# Patient Record
Sex: Male | Born: 1970 | Race: White | Hispanic: No | Smoking: Current every day smoker
Health system: Southern US, Community
[De-identification: ages and names within clinical notes are randomized; demographics above are authoritative.]

## PROBLEM LIST (undated history)

## (undated) DIAGNOSIS — M549 Dorsalgia, unspecified: Secondary | ICD-10-CM

## (undated) DIAGNOSIS — F4310 Post-traumatic stress disorder, unspecified: Secondary | ICD-10-CM

## (undated) DIAGNOSIS — F319 Bipolar disorder, unspecified: Secondary | ICD-10-CM

## (undated) DIAGNOSIS — F410 Panic disorder [episodic paroxysmal anxiety] without agoraphobia: Secondary | ICD-10-CM

## (undated) HISTORY — PX: FACIAL COSMETIC SURGERY: SHX629

## (undated) HISTORY — PX: FINGER SURGERY: SHX640

## (undated) HISTORY — PX: HAND SURGERY: SHX662

## (undated) HISTORY — PX: SHOULDER SURGERY: SHX246

## (undated) HISTORY — PX: CARPAL TUNNEL WITH CUBITAL TUNNEL: SHX5608

---

## 2001-05-05 ENCOUNTER — Emergency Department (HOSPITAL_COMMUNITY): Admission: EM | Admit: 2001-05-05 | Discharge: 2001-05-06 | Payer: Self-pay | Admitting: *Deleted

## 2001-05-06 ENCOUNTER — Encounter: Payer: Self-pay | Admitting: *Deleted

## 2001-07-04 ENCOUNTER — Encounter: Payer: Self-pay | Admitting: Orthopedic Surgery

## 2001-07-04 ENCOUNTER — Ambulatory Visit (HOSPITAL_COMMUNITY): Admission: RE | Admit: 2001-07-04 | Discharge: 2001-07-04 | Payer: Self-pay | Admitting: Orthopedic Surgery

## 2004-09-06 ENCOUNTER — Emergency Department (HOSPITAL_COMMUNITY): Admission: EM | Admit: 2004-09-06 | Discharge: 2004-09-06 | Payer: Self-pay | Admitting: Emergency Medicine

## 2005-06-21 ENCOUNTER — Ambulatory Visit: Payer: Self-pay | Admitting: Family Medicine

## 2005-06-22 ENCOUNTER — Ambulatory Visit: Payer: Self-pay | Admitting: Family Medicine

## 2005-12-25 ENCOUNTER — Emergency Department (HOSPITAL_COMMUNITY): Admission: EM | Admit: 2005-12-25 | Discharge: 2005-12-25 | Payer: Self-pay | Admitting: Emergency Medicine

## 2006-01-26 ENCOUNTER — Ambulatory Visit (HOSPITAL_COMMUNITY): Admission: RE | Admit: 2006-01-26 | Discharge: 2006-01-26 | Payer: Self-pay | Admitting: Orthopedic Surgery

## 2010-12-18 ENCOUNTER — Emergency Department (HOSPITAL_COMMUNITY): Payer: Self-pay

## 2010-12-18 ENCOUNTER — Emergency Department (HOSPITAL_COMMUNITY)
Admission: EM | Admit: 2010-12-18 | Discharge: 2010-12-18 | Disposition: A | Discharge status: Home / Self Care | Payer: Self-pay | Attending: Emergency Medicine | Admitting: Emergency Medicine

## 2010-12-18 DIAGNOSIS — F319 Bipolar disorder, unspecified: Secondary | ICD-10-CM | POA: Insufficient documentation

## 2010-12-18 DIAGNOSIS — J45909 Unspecified asthma, uncomplicated: Secondary | ICD-10-CM | POA: Insufficient documentation

## 2010-12-18 DIAGNOSIS — J069 Acute upper respiratory infection, unspecified: Secondary | ICD-10-CM | POA: Insufficient documentation

## 2010-12-18 DIAGNOSIS — R05 Cough: Secondary | ICD-10-CM | POA: Insufficient documentation

## 2010-12-18 DIAGNOSIS — R059 Cough, unspecified: Secondary | ICD-10-CM | POA: Insufficient documentation

## 2010-12-18 DIAGNOSIS — B9789 Other viral agents as the cause of diseases classified elsewhere: Secondary | ICD-10-CM | POA: Insufficient documentation

## 2010-12-18 DIAGNOSIS — Z79899 Other long term (current) drug therapy: Secondary | ICD-10-CM | POA: Insufficient documentation

## 2010-12-18 LAB — BASIC METABOLIC PANEL
BUN: 13 mg/dL (ref 6–23)
CO2: 25 mEq/L (ref 19–32)
Calcium: 9.3 mg/dL (ref 8.4–10.5)
Chloride: 102 mEq/L (ref 96–112)
Creatinine, Ser: 1.08 mg/dL (ref 0.4–1.5)
GFR calc Af Amer: >60 mL/min (ref >60)
GFR calc non Af Amer: >60 mL/min (ref >60)
Glucose, Bld: 106 mg/dL — ABNORMAL HIGH (ref 70–99)
Potassium: 3.7 mEq/L (ref 3.5–5.1)
Sodium: 136 mEq/L (ref 135–145)

## 2010-12-18 LAB — CBC
HCT: 43.6 % (ref 39.0–52.0)
Hemoglobin: 14.6 g/dL (ref 13.0–17.0)
MCH: 30.4 pg (ref 26.0–34.0)
MCHC: 33.5 g/dL (ref 30.0–36.0)
MCV: 90.8 fL (ref 78.0–100.0)
Platelets: 149 10*3/uL — ABNORMAL LOW (ref 150–400)
RBC: 4.8 MIL/uL (ref 4.22–5.81)
RDW: 13.5 % (ref 11.5–15.5)
WBC: 11.1 10*3/uL — ABNORMAL HIGH (ref 4.0–10.5)

## 2010-12-18 LAB — DIFFERENTIAL
Basophils Absolute: 0 10*3/uL (ref 0.0–0.1)
Basophils Relative: 0 % (ref 0–1)
Eosinophils Absolute: 0.1 10*3/uL (ref 0.0–0.7)
Eosinophils Relative: 1 % (ref 0–5)
Lymphocytes Relative: 20 % (ref 12–46)
Lymphs Abs: 2.2 10*3/uL (ref 0.7–4.0)
Monocytes Absolute: 0.7 10*3/uL (ref 0.1–1.0)
Monocytes Relative: 6 % (ref 3–12)
Neutro Abs: 8.1 10*3/uL — ABNORMAL HIGH (ref 1.7–7.7)
Neutrophils Relative %: 72 % (ref 43–77)

## 2011-03-26 ENCOUNTER — Encounter: Payer: Self-pay | Admitting: *Deleted

## 2011-03-26 ENCOUNTER — Emergency Department (HOSPITAL_COMMUNITY)
Admission: EM | Admit: 2011-03-26 | Discharge: 2011-03-26 | Disposition: A | Discharge status: Home / Self Care | Payer: Self-pay | Attending: Emergency Medicine | Admitting: Emergency Medicine

## 2011-03-26 DIAGNOSIS — F17200 Nicotine dependence, unspecified, uncomplicated: Secondary | ICD-10-CM | POA: Insufficient documentation

## 2011-03-26 DIAGNOSIS — M545 Low back pain, unspecified: Secondary | ICD-10-CM | POA: Insufficient documentation

## 2011-03-26 DIAGNOSIS — F4310 Post-traumatic stress disorder, unspecified: Secondary | ICD-10-CM | POA: Insufficient documentation

## 2011-03-26 DIAGNOSIS — F319 Bipolar disorder, unspecified: Secondary | ICD-10-CM | POA: Insufficient documentation

## 2011-03-26 DIAGNOSIS — S335XXA Sprain of ligaments of lumbar spine, initial encounter: Secondary | ICD-10-CM

## 2011-03-26 DIAGNOSIS — F411 Generalized anxiety disorder: Secondary | ICD-10-CM | POA: Insufficient documentation

## 2011-03-26 HISTORY — DX: Panic disorder (episodic paroxysmal anxiety): F41.0

## 2011-03-26 HISTORY — DX: Dorsalgia, unspecified: M54.9

## 2011-03-26 HISTORY — DX: Bipolar disorder, unspecified: F31.9

## 2011-03-26 HISTORY — DX: Post-traumatic stress disorder, unspecified: F43.10

## 2011-03-26 MED ORDER — KETOROLAC TROMETHAMINE 60 MG/2ML IM SOLN
60.0000 mg | Freq: Once | INTRAMUSCULAR | Status: AC
  Administered 2011-03-26: 60 mg via INTRAMUSCULAR
  Filled 2011-03-26: qty 2

## 2011-03-26 MED ORDER — HYDROMORPHONE HCL 1 MG/ML IJ SOLN
1.0000 mg | Freq: Once | INTRAMUSCULAR | Status: AC
  Administered 2011-03-26: 1 mg via INTRAMUSCULAR
  Filled 2011-03-26: qty 1

## 2011-03-26 MED ORDER — IBUPROFEN 800 MG PO TABS: 800.0000 mg | ORAL_TABLET | Freq: Three times a day (TID) | ORAL | Status: AC

## 2011-03-26 MED ORDER — OXYCODONE-ACETAMINOPHEN 5-325 MG PO TABS: 1.0000 | ORAL_TABLET | ORAL | Status: AC | PRN

## 2011-03-26 NOTE — ED Notes (Signed)
Pt c/o back pain x 15 years

## 2011-03-26 NOTE — ED Notes (Signed)
Pt d/c from ED at this time. Pt ambulatory with a steady gait. Pt was instructed not to drive on narcotics, and stated that his wife would be driving. D/c instructions discussed and understanding was voiced.

## 2011-03-26 NOTE — ED Provider Notes (Signed)
History     CSN: [id number] Arrival date & time: 03/26/2011  3:16 PM  Chief Complaint  Patient presents with  . Back Pain   Patient is a 40 y.o. male presenting with back pain. The history is provided by the patient.  Back Pain  This is a chronic problem. The problem occurs constantly. The problem has not changed since onset.Associated with: History of distant injury. The pain is present in the lumbar spine. The quality of the pain is described as stabbing and shooting. The pain is at a severity of 10/10. The pain is severe. The symptoms are aggravated by bending, twisting and certain positions. The pain is the same all the time. Pertinent negatives include no chest pain, no fever, no numbness, no headaches, no abdominal pain, no bowel incontinence, no perianal numbness, no bladder incontinence, no dysuria, no leg pain, no paresthesias, no paresis, no tingling and no weakness.    Past Medical History  Diagnosis Date  . Back pain   . PTSD (post-traumatic stress disorder)   . Anxiety attack   . Bipolar 1 disorder     Past Surgical History  Procedure Date  . Shoulder surgery right  . Finger surgery     History reviewed. No pertinent family history.  History  Substance Use Topics  . Smoking status: Current Some Day Smoker  . Smokeless tobacco: Not on file  . Alcohol Use: No      Review of Systems  Constitutional: Negative for fever.  HENT: Negative for congestion, sore throat and neck pain.   Eyes: Negative.   Respiratory: Negative for chest tightness and shortness of breath.   Cardiovascular: Negative for chest pain.  Gastrointestinal: Negative for nausea, abdominal pain and bowel incontinence.  Genitourinary: Negative.  Negative for bladder incontinence and dysuria.  Musculoskeletal: Positive for back pain. Negative for joint swelling and arthralgias.  Skin: Negative.  Negative for rash and wound.  Neurological: Negative for dizziness, tingling, weakness,  light-headedness, numbness, headaches and paresthesias.  Hematological: Negative.   Psychiatric/Behavioral: Negative.     Physical Exam  BP 133/85  Pulse 77  Temp(Src) 98.3 F (36.8 C) (Oral)  Resp 20  Ht 5\' 5"  (1.651 m)  Wt 180 lb (81.647 kg)  BMI 29.95 kg/m2  SpO2 100%  Physical Exam  Nursing note and vitals reviewed. Constitutional: He is oriented to person, place, and time. He appears well-developed and well-nourished.  HENT:  Head: Normocephalic and atraumatic.  Eyes: Conjunctivae are normal.  Neck: Normal range of motion.  Cardiovascular: Normal rate, regular rhythm, normal heart sounds and intact distal pulses.   Pulmonary/Chest: Effort normal and breath sounds normal. He has no wheezes.  Abdominal: Soft. Bowel sounds are normal. There is no tenderness.  Musculoskeletal: He exhibits tenderness. He exhibits no edema.       Lumbar back: He exhibits decreased range of motion, tenderness and bony tenderness. He exhibits no swelling, no deformity and no spasm.  Neurological: He is alert and oriented to person, place, and time. He has normal strength. He displays no atrophy. No sensory deficit. He exhibits normal muscle tone. Gait normal.  Reflex Scores:      Patellar reflexes are 2+ on the right side and 2+ on the left side.      Achilles reflexes are 2+ on the right side and 2+ on the left side.      Full ankle flexion and dorsiflexion.  Skin: Skin is warm and dry.  Psychiatric: He has a normal mood and  affect.    ED Course  Procedures  MDM Dilaudid 1 mg IM,  toradol 60 Im given.  Chronic low back pain with no neuro deficits,  No new injury      Candis Musa, PA 03/26/11 1718

## 2011-04-20 NOTE — ED Provider Notes (Signed)
Medical screening examination/treatment/procedure(s) were performed by non-physician practitioner and as supervising physician I was immediately available for consultation/collaboration.  Donnetta Hutching, MD 04/20/11 [phone]

## 2012-04-07 ENCOUNTER — Emergency Department (HOSPITAL_COMMUNITY): Payer: Self-pay

## 2012-04-07 ENCOUNTER — Emergency Department (HOSPITAL_COMMUNITY)
Admission: EM | Admit: 2012-04-07 | Discharge: 2012-04-07 | Disposition: A | Discharge status: Home / Self Care | Payer: Self-pay | Attending: Emergency Medicine | Admitting: Emergency Medicine

## 2012-04-07 ENCOUNTER — Encounter (HOSPITAL_COMMUNITY): Payer: Self-pay | Admitting: *Deleted

## 2012-04-07 DIAGNOSIS — F319 Bipolar disorder, unspecified: Secondary | ICD-10-CM | POA: Insufficient documentation

## 2012-04-07 DIAGNOSIS — J302 Other seasonal allergic rhinitis: Secondary | ICD-10-CM

## 2012-04-07 DIAGNOSIS — J019 Acute sinusitis, unspecified: Secondary | ICD-10-CM | POA: Insufficient documentation

## 2012-04-07 DIAGNOSIS — J309 Allergic rhinitis, unspecified: Secondary | ICD-10-CM | POA: Insufficient documentation

## 2012-04-07 DIAGNOSIS — F4310 Post-traumatic stress disorder, unspecified: Secondary | ICD-10-CM | POA: Insufficient documentation

## 2012-04-07 DIAGNOSIS — F17200 Nicotine dependence, unspecified, uncomplicated: Secondary | ICD-10-CM | POA: Insufficient documentation

## 2012-04-07 MED ORDER — AZITHROMYCIN 250 MG PO TABS: 500.0000 mg | ORAL_TABLET | Freq: Once | ORAL | Status: DC

## 2012-04-07 MED ORDER — SULFAMETHOXAZOLE-TMP DS 800-160 MG PO TABS
1.0000 | ORAL_TABLET | Freq: Once | ORAL | Status: AC
  Administered 2012-04-07: 1 via ORAL
  Filled 2012-04-07: qty 1

## 2012-04-07 MED ORDER — SULFAMETHOXAZOLE-TMP DS 800-160 MG PO TABS: 1.0000 | ORAL_TABLET | Freq: Two times a day (BID) | ORAL | Status: AC

## 2012-04-07 MED ORDER — FEXOFENADINE-PSEUDOEPHED ER 60-120 MG PO TB12: 1.0000 | ORAL_TABLET | Freq: Two times a day (BID) | ORAL | Status: DC

## 2012-04-07 NOTE — ED Notes (Signed)
Cough for 1 month, white sputum at times.  Coughed today and cannot hear out of rt ear.

## 2012-04-07 NOTE — ED Provider Notes (Signed)
History     CSN: [id number]  Arrival date & time 04/07/12  1932   First MD Initiated Contact with Patient 04/07/12 1959      Chief Complaint  Patient presents with  . Cough    (Consider location/radiation/quality/duration/timing/severity/associated sxs/prior treatment) HPI Comments: Thomas Bird presents with a one month history of persistent cough, nasal congestion drip and increasing right ear pain, pressure and decreased hearing on his right stating he feels like his ear is under water.  He denies current fevers or chills but states he had a several day history of fever when his symptoms began.  Nasal drainage has been thick and yellow without blood tinge.  He denies shortness of breath and chest pain.  He also denies sore throat but does have tenderness across his right cheek.  He has taken no medications for relief of symptoms.  The history is provided by the patient and the spouse.    Past Medical History  Diagnosis Date  . Back pain   . PTSD (post-traumatic stress disorder)   . Anxiety attack   . Bipolar 1 disorder     Past Surgical History  Procedure Date  . Shoulder surgery right  . Finger surgery     History reviewed. No pertinent family history.  History  Substance Use Topics  . Smoking status: Current Some Day Smoker  . Smokeless tobacco: Not on file  . Alcohol Use: No      Review of Systems  Constitutional: Negative for fever and chills.  HENT: Positive for hearing loss, congestion, rhinorrhea, postnasal drip and sinus pressure. Negative for ear pain, sore throat, facial swelling, neck pain, neck stiffness, voice change and tinnitus.   Eyes: Negative.   Respiratory: Positive for cough. Negative for chest tightness, shortness of breath, wheezing and stridor.   Cardiovascular: Negative for chest pain.  Gastrointestinal: Negative for nausea and abdominal pain.  Genitourinary: Negative.   Musculoskeletal: Negative for joint swelling and arthralgias.    Skin: Negative.  Negative for rash and wound.  Neurological: Negative for dizziness, weakness, light-headedness, numbness and headaches.  Hematological: Negative.   Psychiatric/Behavioral: Negative.     Allergies  Penicillins  Home Medications   Current Outpatient Rx  Name Route Sig Dispense Refill  . DIAZEPAM 10 MG PO TABS Oral Take 10 mg by mouth 5 (five) times daily.      Marland Kitchen DIVALPROEX SODIUM 500 MG PO TBEC Oral Take 1,000 mg by mouth at bedtime.     Marland Kitchen FEXOFENADINE-PSEUDOEPHED ER 60-120 MG PO TB12 Oral Take 1 tablet by mouth every 12 (twelve) hours. 30 tablet 0  . GABAPENTIN 300 MG PO CAPS Oral Take 300 mg by mouth 3 (three) times daily.      . SULFAMETHOXAZOLE-TMP DS 800-160 MG PO TABS Oral Take 1 tablet by mouth 2 (two) times daily. 28 tablet 0    BP 125/73  Pulse 75  Temp 98.4 F (36.9 C) (Oral)  Resp 20  Ht 5\' 5"  (1.651 m)  Wt 160 lb (72.576 kg)  BMI 26.63 kg/m2  SpO2 97%  Physical Exam  Nursing note and vitals reviewed. Constitutional: He appears well-developed and well-nourished.  HENT:  Head: Normocephalic and atraumatic.  Left Ear: External ear normal.  Nose: Mucosal edema and rhinorrhea present. No sinus tenderness. Right sinus exhibits maxillary sinus tenderness.       Right eardrum is retracted.  There is no effusion or purulence behind the TM.  Eyes: Conjunctivae are normal.  Neck: Normal range of  motion.  Cardiovascular: Normal rate, regular rhythm, normal heart sounds and intact distal pulses.   Pulmonary/Chest: Effort normal and breath sounds normal. He has no wheezes.  Abdominal: Soft. Bowel sounds are normal. There is no tenderness.  Musculoskeletal: Normal range of motion.  Neurological: He is alert.  Skin: Skin is warm and dry.  Psychiatric: He has a normal mood and affect.    ED Course  Procedures (including critical care time)  Labs Reviewed - No data to display Dg Chest 2 View  04/07/2012  *RADIOLOGY REPORT*  Clinical Data:  Cough and  congestion.  History of asthma.  CHEST - 2 VIEW  Comparison: 12/18/2010  Findings: The heart size and mediastinal contours are within normal limits.  Both lungs are clear.  The visualized skeletal structures are unremarkable.  IMPRESSION: No active disease.   Original Report Authenticated By: Reola Calkins, M.D.      1. Sinusitis acute   2. Seasonal allergic rhinitis       MDM  Patient was prescribed a 14 day course of Bactrim as he is pen allergic.  Also prescribed Allegra-D for nasal congestion and drainage.  Discussed smoking cessation, patient states has tried in the past without success and has been smoking since the age of 32.  He was referred for primary care to [facility] Department, in the interim recommended to return here if symptoms persist or worsen.        Burgess Amor, [place] 04/07/12 2116

## 2012-04-07 NOTE — ED Notes (Signed)
Pt taken to xray 

## 2012-04-08 NOTE — ED Provider Notes (Signed)
Medical screening examination/treatment/procedure(s) were performed by non-physician practitioner and as supervising physician I was immediately available for consultation/collaboration.   [place] Alfred Grosser L Macsen Nuttall, MD 04/08/12 1935 

## 2012-05-20 ENCOUNTER — Emergency Department (HOSPITAL_COMMUNITY): Payer: Self-pay

## 2012-05-20 ENCOUNTER — Encounter (HOSPITAL_COMMUNITY): Payer: Self-pay | Admitting: *Deleted

## 2012-05-20 ENCOUNTER — Emergency Department (HOSPITAL_COMMUNITY)
Admission: EM | Admit: 2012-05-20 | Discharge: 2012-05-21 | Disposition: A | Discharge status: Home / Self Care | Payer: Self-pay | Attending: Emergency Medicine | Admitting: Emergency Medicine

## 2012-05-20 DIAGNOSIS — J40 Bronchitis, not specified as acute or chronic: Secondary | ICD-10-CM | POA: Insufficient documentation

## 2012-05-20 DIAGNOSIS — M549 Dorsalgia, unspecified: Secondary | ICD-10-CM | POA: Insufficient documentation

## 2012-05-20 DIAGNOSIS — J029 Acute pharyngitis, unspecified: Secondary | ICD-10-CM

## 2012-05-20 DIAGNOSIS — R51 Headache: Secondary | ICD-10-CM | POA: Insufficient documentation

## 2012-05-20 MED ORDER — METHYLPREDNISOLONE SODIUM SUCC 125 MG IJ SOLR
125.0000 mg | Freq: Once | INTRAMUSCULAR | Status: AC
  Administered 2012-05-20: 125 mg via INTRAMUSCULAR
  Filled 2012-05-20: qty 2

## 2012-05-20 MED ORDER — HYDROCOD POLST-CHLORPHEN POLST 10-8 MG/5ML PO LQCR
5.0000 mL | Freq: Once | ORAL | Status: AC
  Administered 2012-05-20: 5 mL via ORAL
  Filled 2012-05-20: qty 5

## 2012-05-20 MED ORDER — ALBUTEROL SULFATE HFA 108 (90 BASE) MCG/ACT IN AERS
2.0000 | INHALATION_SPRAY | RESPIRATORY_TRACT | Status: DC
  Administered 2012-05-20: 2 via RESPIRATORY_TRACT
  Filled 2012-05-20: qty 6.7

## 2012-05-20 NOTE — ED Notes (Signed)
C/o throat swelling for 2 months, been antibiotics and has finished all of them, states that throat is raw

## 2012-05-20 NOTE — ED Provider Notes (Signed)
History     CSN: [id number]  Arrival date & time 05/20/12  2101   First MD Initiated Contact with Patient 05/20/12 2301      Chief Complaint  Patient presents with  . Sore Throat    (Consider location/radiation/quality/duration/timing/severity/associated sxs/prior treatment) Patient is a 41 y.o. male presenting with pharyngitis. The history is provided by the patient.  Sore Throat This is a recurrent problem. The current episode started more than 1 month ago. The problem has been gradually worsening. Associated symptoms include chills, congestion, headaches, myalgias and a sore throat. Pertinent negatives include no abdominal pain, arthralgias, chest pain, coughing or neck pain. Associated symptoms comments: Nasal congestion and hoarseness. The symptoms are aggravated by swallowing. He has tried nothing for the symptoms. The treatment provided no relief.    Past Medical History  Diagnosis Date  . Back pain   . PTSD (post-traumatic stress disorder)   . Anxiety attack   . Bipolar 1 disorder     Past Surgical History  Procedure Date  . Shoulder surgery right  . Finger surgery     History reviewed. No pertinent family history.  History  Substance Use Topics  . Smoking status: Current Some Day Smoker    Types: Cigarettes  . Smokeless tobacco: Not on file  . Alcohol Use: No      Review of Systems  Constitutional: Positive for chills. Negative for activity change.       All ROS Neg except as noted in HPI  HENT: Positive for congestion, sore throat and postnasal drip. Negative for nosebleeds and neck pain.   Eyes: Negative for photophobia and discharge.  Respiratory: Negative for cough, shortness of breath and wheezing.   Cardiovascular: Negative for chest pain and palpitations.  Gastrointestinal: Negative for abdominal pain and blood in stool.  Genitourinary: Negative for dysuria, frequency and hematuria.  Musculoskeletal: Positive for myalgias and back pain. Negative  for arthralgias.  Skin: Negative.   Neurological: Positive for headaches. Negative for dizziness, seizures and speech difficulty.  Psychiatric/Behavioral: Negative for hallucinations and confusion. The patient is nervous/anxious.     Allergies  Penicillins  Home Medications   Current Outpatient Rx  Name Route Sig Dispense Refill  . DIAZEPAM 10 MG PO TABS Oral Take 10 mg by mouth 3 (three) times daily.     Marland Kitchen DIVALPROEX SODIUM 500 MG PO TBEC Oral Take 1,000 mg by mouth at bedtime.     Marland Kitchen GABAPENTIN 300 MG PO CAPS Oral Take 300 mg by mouth 3 (three) times daily.        BP 118/77  Pulse 70  Temp 98.7 F (37.1 C) (Oral)  Resp 18  Ht 5\' 5"  (1.651 m)  Wt 145 lb (65.772 kg)  BMI 24.13 kg/m2  SpO2 99%  Physical Exam  Nursing note and vitals reviewed. Constitutional: He is oriented to person, place, and time. He appears well-developed and well-nourished.  Non-toxic appearance.  HENT:  Head: Normocephalic.  Right Ear: Tympanic membrane and external ear normal.  Left Ear: Tympanic membrane and external ear normal.  Eyes: EOM and lids are normal. Pupils are equal, round, and reactive to light.  Neck: Normal range of motion. Neck supple. Carotid bruit is not present. No tracheal deviation present.  Cardiovascular: Normal rate, regular rhythm, normal heart sounds, intact distal pulses and normal pulses.   Pulmonary/Chest: Breath sounds normal. No stridor. No respiratory distress.  Abdominal: Soft. Bowel sounds are normal. There is no tenderness. There is no guarding.  Musculoskeletal: Normal  range of motion.  Lymphadenopathy:       Head (right side): No submandibular adenopathy present.       Head (left side): No submandibular adenopathy present.    He has no cervical adenopathy.  Neurological: He is alert and oriented to person, place, and time. He has normal strength. No cranial nerve deficit or sensory deficit.  Skin: Skin is warm and dry.  Psychiatric: He has a normal mood and  affect. His speech is normal.    ED Course  Procedures (including critical care time)  Labs Reviewed - No data to display No results found.   No diagnosis found.    MDM   Soft tissue neck xray is negative. Vital signs stable . Pt able to manage salava, speaks incomplete sentences. Safe for pt to be discharged home.       Kathie Dike, [place] 05/26/12 [phone]

## 2012-05-21 MED ORDER — PROMETHAZINE-CODEINE 6.25-10 MG/5ML PO SYRP: 5.0000 mL | ORAL_SOLUTION | ORAL | Status: DC | PRN

## 2012-05-21 MED ORDER — PREDNISONE 10 MG PO TABS: ORAL_TABLET | ORAL | Status: DC

## 2012-05-21 MED ORDER — CIPROFLOXACIN HCL 500 MG PO TABS: 500.0000 mg | ORAL_TABLET | Freq: Two times a day (BID) | ORAL | Status: DC

## 2012-05-21 MED ORDER — ONDANSETRON 8 MG PO TBDP: 8.0000 mg | ORAL_TABLET | Freq: Once | ORAL | Status: DC

## 2012-05-21 MED ORDER — CIPROFLOXACIN HCL 250 MG PO TABS
500.0000 mg | ORAL_TABLET | Freq: Once | ORAL | Status: AC
  Administered 2012-05-21: 500 mg via ORAL
  Filled 2012-05-21: qty 2

## 2012-05-21 MED ORDER — PSEUDOEPHEDRINE HCL 60 MG PO TABS: ORAL_TABLET | ORAL | Status: DC

## 2012-05-21 NOTE — ED Notes (Signed)
Nausea gone and no longer feels faint

## 2012-05-27 NOTE — ED Provider Notes (Signed)
Medical screening examination/treatment/procedure(s) were performed by non-physician practitioner and as supervising physician I was immediately available for consultation/collaboration.  Nicoletta Dress. Colon Branch, MD 05/27/12 2342

## 2012-07-12 ENCOUNTER — Emergency Department (HOSPITAL_COMMUNITY)
Admission: EM | Admit: 2012-07-12 | Discharge: 2012-07-12 | Disposition: A | Discharge status: Home / Self Care | Payer: Self-pay | Attending: Emergency Medicine | Admitting: Emergency Medicine

## 2012-07-12 ENCOUNTER — Emergency Department (HOSPITAL_COMMUNITY): Payer: Self-pay

## 2012-07-12 ENCOUNTER — Encounter (HOSPITAL_COMMUNITY): Payer: Self-pay | Admitting: *Deleted

## 2012-07-12 DIAGNOSIS — F17200 Nicotine dependence, unspecified, uncomplicated: Secondary | ICD-10-CM | POA: Insufficient documentation

## 2012-07-12 DIAGNOSIS — Y939 Activity, unspecified: Secondary | ICD-10-CM | POA: Insufficient documentation

## 2012-07-12 DIAGNOSIS — W19XXXA Unspecified fall, initial encounter: Secondary | ICD-10-CM | POA: Insufficient documentation

## 2012-07-12 DIAGNOSIS — S62309A Unspecified fracture of unspecified metacarpal bone, initial encounter for closed fracture: Secondary | ICD-10-CM | POA: Insufficient documentation

## 2012-07-12 DIAGNOSIS — M2550 Pain in unspecified joint: Secondary | ICD-10-CM | POA: Insufficient documentation

## 2012-07-12 DIAGNOSIS — F411 Generalized anxiety disorder: Secondary | ICD-10-CM | POA: Insufficient documentation

## 2012-07-12 DIAGNOSIS — F4310 Post-traumatic stress disorder, unspecified: Secondary | ICD-10-CM | POA: Insufficient documentation

## 2012-07-12 DIAGNOSIS — F319 Bipolar disorder, unspecified: Secondary | ICD-10-CM | POA: Insufficient documentation

## 2012-07-12 DIAGNOSIS — Y929 Unspecified place or not applicable: Secondary | ICD-10-CM | POA: Insufficient documentation

## 2012-07-12 DIAGNOSIS — Z79899 Other long term (current) drug therapy: Secondary | ICD-10-CM | POA: Insufficient documentation

## 2012-07-12 MED ORDER — HYDROCODONE-ACETAMINOPHEN 5-325 MG PO TABS
2.0000 | ORAL_TABLET | Freq: Once | ORAL | Status: AC
  Administered 2012-07-12: 2 via ORAL
  Filled 2012-07-12: qty 2

## 2012-07-12 MED ORDER — IBUPROFEN 800 MG PO TABS
800.0000 mg | ORAL_TABLET | Freq: Once | ORAL | Status: AC
  Administered 2012-07-12: 800 mg via ORAL
  Filled 2012-07-12: qty 1

## 2012-07-12 MED ORDER — HYDROCODONE-ACETAMINOPHEN 5-325 MG PO TABS: ORAL_TABLET | ORAL | Status: DC

## 2012-07-12 MED ORDER — IBUPROFEN 800 MG PO TABS: 800.0000 mg | ORAL_TABLET | Freq: Three times a day (TID) | ORAL | Status: DC

## 2012-07-12 NOTE — ED Provider Notes (Signed)
History     CSN: [id number]  Arrival date & time 07/12/12  1343   First MD Initiated Contact with Patient 07/12/12 1401      Chief Complaint  Patient presents with  . Hand Pain    (Consider location/radiation/quality/duration/timing/severity/associated sxs/prior treatment) HPI Comments: Patient c/o persistent pain and swelling to the right hand for one week.  Pain began after a fall onto concrete with fists clenched.  States he was seen at another ED and had a temporary cast placed.  He states that he had to remove it two days later due to increased pain and swelling to his hand.  States that he was advised to follow-up with orthopedics, but does not have the finances to do so.  He denies numbness or weakness of the fingers, redness, or proximal pain.    Patient is a 41 y.o. male presenting with hand pain. The history is provided by the patient.  Hand Pain This is a new problem. The current episode started 1 to 4 weeks ago. The problem occurs constantly. The problem has been unchanged. Associated symptoms include arthralgias and joint swelling. Pertinent negatives include no chest pain, chills, fever, nausea, neck pain, numbness, rash, vomiting or weakness. The symptoms are aggravated by bending (movement and palpation). He has tried NSAIDs and immobilization for the symptoms. The treatment provided no relief.    Past Medical History  Diagnosis Date  . Back pain   . PTSD (post-traumatic stress disorder)   . Anxiety attack   . Bipolar 1 disorder     Past Surgical History  Procedure Date  . Shoulder surgery right  . Finger surgery     No family history on file.  History  Substance Use Topics  . Smoking status: Current Some Day Smoker    Types: Cigarettes  . Smokeless tobacco: Not on file  . Alcohol Use: No      Review of Systems  Constitutional: Negative for fever and chills.  HENT: Negative for neck pain.   Cardiovascular: Negative for chest pain.  Gastrointestinal:  Negative for nausea and vomiting.  Genitourinary: Negative for dysuria and difficulty urinating.  Musculoskeletal: Positive for joint swelling and arthralgias.  Skin: Negative for color change, rash and wound.  Neurological: Negative for weakness and numbness.  All other systems reviewed and are negative.    Allergies  Penicillins  Home Medications   Current Outpatient Rx  Name  Route  Sig  Dispense  Refill  . DIAZEPAM 10 MG PO TABS   Oral   Take 10 mg by mouth 3 (three) times daily.          Marland Kitchen DIVALPROEX SODIUM 500 MG PO TBEC   Oral   Take 1,000 mg by mouth at bedtime.            BP 112/70  Pulse 74  Temp 97.8 F (36.6 C) (Oral)  Resp 20  Ht 5\' 5"  (1.651 m)  Wt 155 lb 6 oz (70.478 kg)  BMI 25.86 kg/m2  SpO2 100%  Physical Exam  Nursing note and vitals reviewed. Constitutional: He is oriented to person, place, and time. He appears well-developed and well-nourished. No distress.  HENT:  Head: Normocephalic and atraumatic.  Cardiovascular: Normal rate, regular rhythm, normal heart sounds and intact distal pulses.   Pulmonary/Chest: Effort normal and breath sounds normal.  Musculoskeletal: He exhibits tenderness. He exhibits no edema.       Right hand: He exhibits decreased range of motion, tenderness, bony tenderness, deformity and  swelling. He exhibits normal two-point discrimination, normal capillary refill and no laceration. normal sensation noted. Normal strength noted.       Hands:      ttp of the dorsal right hand. Moderate STS present.  Pain reproduced with extension of the fifth finger.  Radial pulse is brisk, distal sensation intact.  CR< 2 sec.    Neurological: He is alert and oriented to person, place, and time. He exhibits normal muscle tone. Coordination normal.  Skin: Skin is warm and dry.    ED Course  Procedures (including critical care time)  Labs Reviewed - No data to display Dg Hand Complete Right  07/12/2012  *RADIOLOGY REPORT*  Clinical  Data: Fall, fifth metacarpal pain  RIGHT HAND - COMPLETE 3+ VIEW  Comparison: None.  Findings: Three views of the right hand submitted.  Mild displaced fracture distal aspect right fifth metacarpal.  IMPRESSION: Mild displaced fracture distal aspect right fifth metacarpal.   Original Report Authenticated By: Natasha Mead, M.D.      Ulnar gutter splint applied to the right hand by nursing. Pain improved. Remains neurovascularly intact.   MDM    Patient with a mildly displaced fifth metacarpal fracture. No focal neuro deficits on exam.  I will refer to orthopedics, Dr. Romeo Apple.  Patient reviewed on the [facility] narcotics database. Last narcotic prescription was #8 Percocet tablets on 06/30/2012  Prescribed: norco #15 ibuprofen  Alka Falwell L. [place], [place] 07/13/12 2202

## 2012-07-12 NOTE — ED Notes (Signed)
Reports fell and broke right hand x 1 wk ago.  Seen at [facility] ED and given temporary cast.  States was told to go to PCP for permanent cast.  Reports removed temporary cast due to pain and swelling.  Here today to have a cast put on.

## 2012-07-14 NOTE — ED Provider Notes (Signed)
Medical screening examination/treatment/procedure(s) were performed by non-physician practitioner and as supervising physician I was immediately available for consultation/collaboration. Shanee Batch, MD, FACEP   Jozeph Persing L Mancel Lardizabal, MD 07/14/12 2119 

## 2013-02-19 ENCOUNTER — Emergency Department (HOSPITAL_COMMUNITY)
Admission: EM | Admit: 2013-02-19 | Discharge: 2013-02-20 | Disposition: A | Discharge status: Home / Self Care | Payer: Self-pay | Attending: Emergency Medicine | Admitting: Emergency Medicine

## 2013-02-19 ENCOUNTER — Emergency Department (HOSPITAL_COMMUNITY): Payer: Self-pay

## 2013-02-19 ENCOUNTER — Encounter (HOSPITAL_COMMUNITY): Payer: Self-pay | Admitting: *Deleted

## 2013-02-19 DIAGNOSIS — Z880 Allergy status to penicillin: Secondary | ICD-10-CM | POA: Insufficient documentation

## 2013-02-19 DIAGNOSIS — IMO0002 Reserved for concepts with insufficient information to code with codable children: Secondary | ICD-10-CM | POA: Insufficient documentation

## 2013-02-19 DIAGNOSIS — S46911A Strain of unspecified muscle, fascia and tendon at shoulder and upper arm level, right arm, initial encounter: Secondary | ICD-10-CM

## 2013-02-19 DIAGNOSIS — X58XXXA Exposure to other specified factors, initial encounter: Secondary | ICD-10-CM | POA: Insufficient documentation

## 2013-02-19 DIAGNOSIS — Z9889 Other specified postprocedural states: Secondary | ICD-10-CM | POA: Insufficient documentation

## 2013-02-19 DIAGNOSIS — F17200 Nicotine dependence, unspecified, uncomplicated: Secondary | ICD-10-CM | POA: Insufficient documentation

## 2013-02-19 DIAGNOSIS — Z8659 Personal history of other mental and behavioral disorders: Secondary | ICD-10-CM | POA: Insufficient documentation

## 2013-02-19 DIAGNOSIS — Y929 Unspecified place or not applicable: Secondary | ICD-10-CM | POA: Insufficient documentation

## 2013-02-19 DIAGNOSIS — Y939 Activity, unspecified: Secondary | ICD-10-CM | POA: Insufficient documentation

## 2013-02-19 DIAGNOSIS — Z79899 Other long term (current) drug therapy: Secondary | ICD-10-CM | POA: Insufficient documentation

## 2013-02-19 MED ORDER — HYDROCODONE-ACETAMINOPHEN 5-325 MG PO TABS
1.0000 | ORAL_TABLET | Freq: Once | ORAL | Status: AC
  Administered 2013-02-19: 1 via ORAL
  Filled 2013-02-19: qty 1

## 2013-02-19 MED ORDER — HYDROCODONE-ACETAMINOPHEN 5-325 MG PO TABS: 1.0000 | ORAL_TABLET | ORAL | Status: DC | PRN

## 2013-02-19 MED ORDER — ACYCLOVIR 800 MG PO TABS: 800.0000 mg | ORAL_TABLET | Freq: Every day | ORAL | Status: DC

## 2013-02-19 NOTE — ED Notes (Signed)
Rt shoulder pain since yesterday, no known injury.

## 2013-02-23 NOTE — ED Provider Notes (Signed)
History    CSN: [id number] Arrival date & time 02/19/13  2058  First MD Initiated Contact with Patient 02/19/13 2152     Chief Complaint  Patient presents with  . Shoulder Pain   (Consider location/radiation/quality/duration/timing/severity/associated sxs/prior Treatment) HPI Comments: Thomas Bird is a 42 y.o. Male presenting with right posterior shoulder pain which has been present for the past week,  But worse since yesterday.  He describes a burning pain that occasionally radiates into his right upper posterior arm.  He denies injury, but does have a history of prior rotator cuff repair of this shoulder.  Pain is constant,  But worse with movement.  He denies weakness or numbness in his right arm and denies fevers, chills, neck pain or injury and denies rash.He has taken no medicines and has found no alleviators of his pain.     The history is provided by the patient and the spouse.   Past Medical History  Diagnosis Date  . Back pain   . PTSD (post-traumatic stress disorder)   . Anxiety attack   . Bipolar 1 disorder    Past Surgical History  Procedure Laterality Date  . Shoulder surgery  right  . Finger surgery     History reviewed. No pertinent family history. History  Substance Use Topics  . Smoking status: Current Some Day Smoker    Types: Cigarettes  . Smokeless tobacco: Not on file  . Alcohol Use: No    Review of Systems  Constitutional: Negative for fever and chills.  Musculoskeletal: Positive for arthralgias. Negative for myalgias and joint swelling.  Skin: Negative for color change, rash and wound.  Neurological: Negative for weakness and numbness.    Allergies  Penicillins  Home Medications   Current Outpatient Rx  Name  Route  Sig  Dispense  Refill  . acyclovir (ZOVIRAX) 800 MG tablet   Oral   Take 1 tablet (800 mg total) by mouth 5 (five) times daily.   35 tablet   0   . HYDROcodone-acetaminophen (NORCO/VICODIN) 5-325 MG per tablet  Oral   Take 1 tablet by mouth every 4 (four) hours as needed for pain.   15 tablet   0    BP 134/84  Pulse 80  Temp(Src) 98.1 F (36.7 C) (Oral)  Resp 20  Ht 5\' 5"  (1.651 m)  Wt 150 lb (68.04 kg)  BMI 24.96 kg/m2  SpO2 98% Physical Exam  Constitutional: He appears well-developed and well-nourished.  HENT:  Head: Atraumatic.  Neck: Normal range of motion.  Cardiovascular:  Pulses equal bilaterally  Musculoskeletal: He exhibits tenderness. He exhibits no edema.       Right shoulder: He exhibits tenderness. He exhibits normal range of motion, no bony tenderness, no swelling, no effusion, no crepitus, no deformity, no spasm and normal strength.       Arms: Neurological: He is alert. He has normal strength. He displays normal reflexes. No sensory deficit.  Equal strength  Skin: Skin is warm and dry.  Psychiatric: He has a normal mood and affect.    ED Course  Procedures (including critical care time) Labs Reviewed - No data to display No results found. 1. 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  He was prescribed hydrocodone for pain, encouraged warm compresses. Burning sensation suspicious for early shingles flare - he was also prescribed acyclovir,  But encouraged to get this  filled only if rash develops.  Resource guide given for obtaining pcp, in interim,  Return here for worsened sx.  Burgess Amor, PA-C 02/23/13 2333

## 2013-02-24 NOTE — ED Provider Notes (Signed)
Medical screening examination/treatment/procedure(s) were performed by non-physician practitioner and as supervising physician I was immediately available for consultation/collaboration.   Benny Lennert, MD 02/24/13 [phone]

## 2013-05-20 ENCOUNTER — Emergency Department (HOSPITAL_COMMUNITY)
Admission: EM | Admit: 2013-05-20 | Discharge: 2013-05-20 | Disposition: A | Discharge status: Home / Self Care | Payer: No Typology Code available for payment source | Attending: Emergency Medicine | Admitting: Emergency Medicine

## 2013-05-20 ENCOUNTER — Encounter (HOSPITAL_COMMUNITY): Payer: Self-pay | Admitting: Emergency Medicine

## 2013-05-20 ENCOUNTER — Emergency Department (HOSPITAL_COMMUNITY): Payer: No Typology Code available for payment source

## 2013-05-20 DIAGNOSIS — Z8659 Personal history of other mental and behavioral disorders: Secondary | ICD-10-CM | POA: Insufficient documentation

## 2013-05-20 DIAGNOSIS — Y9389 Activity, other specified: Secondary | ICD-10-CM | POA: Insufficient documentation

## 2013-05-20 DIAGNOSIS — S93409A Sprain of unspecified ligament of unspecified ankle, initial encounter: Secondary | ICD-10-CM | POA: Insufficient documentation

## 2013-05-20 DIAGNOSIS — Y92410 Unspecified street and highway as the place of occurrence of the external cause: Secondary | ICD-10-CM | POA: Insufficient documentation

## 2013-05-20 DIAGNOSIS — F17200 Nicotine dependence, unspecified, uncomplicated: Secondary | ICD-10-CM | POA: Insufficient documentation

## 2013-05-20 DIAGNOSIS — Z880 Allergy status to penicillin: Secondary | ICD-10-CM | POA: Insufficient documentation

## 2013-05-20 MED ORDER — HYDROCODONE-ACETAMINOPHEN 5-325 MG PO TABS
1.0000 | ORAL_TABLET | Freq: Once | ORAL | Status: AC
  Administered 2013-05-20: 1 via ORAL
  Filled 2013-05-20: qty 1

## 2013-05-20 MED ORDER — IBUPROFEN 600 MG PO TABS: 600.0000 mg | ORAL_TABLET | Freq: Four times a day (QID) | ORAL | Status: DC | PRN

## 2013-05-20 MED ORDER — HYDROCODONE-ACETAMINOPHEN 5-325 MG PO TABS: 1.0000 | ORAL_TABLET | Freq: Four times a day (QID) | ORAL | Status: DC | PRN
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
## 2013-05-20 NOTE — ED Notes (Signed)
Patient provided ice pack for right ankle.
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
## 2013-05-20 NOTE — ED Provider Notes (Signed)
Medical screening examination/treatment/procedure(s) were performed by non-physician practitioner and as supervising physician I was immediately available for consultation/collaboration.    Kanija Remmel D Ludwin Flahive, MD 05/20/13 2000 

## 2015-04-28 ENCOUNTER — Encounter: Payer: Self-pay | Admitting: Family Medicine

## 2015-04-28 ENCOUNTER — Ambulatory Visit: Payer: Self-pay | Admitting: Family Medicine

## 2015-04-28 VITALS — BP 117/73 | HR 62 | Temp 97.4°F | Ht 65.0 in | Wt 148.6 lb

## 2015-04-28 DIAGNOSIS — F4310 Post-traumatic stress disorder, unspecified: Secondary | ICD-10-CM

## 2015-04-28 DIAGNOSIS — M79643 Pain in unspecified hand: Secondary | ICD-10-CM

## 2015-04-28 DIAGNOSIS — M21931 Unspecified acquired deformity of right forearm: Secondary | ICD-10-CM
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
## 2016-05-11 ENCOUNTER — Encounter (HOSPITAL_COMMUNITY): Payer: Self-pay | Admitting: Emergency Medicine

## 2016-05-11 ENCOUNTER — Emergency Department (HOSPITAL_COMMUNITY)
Admission: EM | Admit: 2016-05-11 | Discharge: 2016-05-13 | Disposition: A | Discharge status: Other Acute Inpatient Hospital | Payer: Self-pay

## 2016-05-11 DIAGNOSIS — Y939 Activity, unspecified: Secondary | ICD-10-CM | POA: Insufficient documentation

## 2016-05-11 DIAGNOSIS — F1010 Alcohol abuse, uncomplicated: Secondary | ICD-10-CM | POA: Diagnosis present

## 2016-05-11 DIAGNOSIS — Z79899 Other long term (current) drug therapy: Secondary | ICD-10-CM | POA: Insufficient documentation

## 2016-05-11 DIAGNOSIS — S40012A Contusion of left shoulder, initial encounter: Secondary | ICD-10-CM | POA: Insufficient documentation

## 2016-05-11 DIAGNOSIS — R45851 Suicidal ideations: Secondary | ICD-10-CM | POA: Insufficient documentation

## 2016-05-11 DIAGNOSIS — F332 Major depressive disorder, recurrent severe without psychotic features: Secondary | ICD-10-CM | POA: Diagnosis present

## 2016-05-11 DIAGNOSIS — F17210 Nicotine dependence, cigarettes, uncomplicated: Secondary | ICD-10-CM | POA: Insufficient documentation

## 2016-05-11 DIAGNOSIS — Y929 Unspecified place or not applicable: Secondary | ICD-10-CM | POA: Insufficient documentation

## 2016-05-11 DIAGNOSIS — Y999 Unspecified external cause status: Secondary | ICD-10-CM | POA: Insufficient documentation

## 2016-05-11 MED ORDER — LORAZEPAM 1 MG PO TABS
0.0000 mg | ORAL_TABLET | Freq: Four times a day (QID) | ORAL | Status: AC
  Administered 2016-05-12 – 2016-05-13 (×2): 1 mg via ORAL
  Filled 2016-05-11 (×2): qty 1

## 2016-05-11 MED ORDER — NAPROXEN 250 MG PO TABS
500.0000 mg | ORAL_TABLET | Freq: Once | ORAL | Status: AC
  Administered 2016-05-12: 500 mg via ORAL
  Filled 2016-05-11: qty 2

## 2016-05-11 MED ORDER — LORAZEPAM 1 MG PO TABS: 0.0000 mg | ORAL_TABLET | Freq: Two times a day (BID) | ORAL | Status: DC
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
## 2016-05-11 NOTE — ED Notes (Signed)
Staffing office and charge nurse notified for pt.'s sitter , purple scrubs given to pt. , security notified to wand pt.  

## 2016-05-12 ENCOUNTER — Emergency Department (HOSPITAL_COMMUNITY): Payer: Self-pay

## 2016-05-12 LAB — CBC WITH DIFFERENTIAL/PLATELET
Basophils Absolute: 0 10*3/uL (ref 0.0–0.1)
Basophils Relative: 0 %
Eosinophils Absolute: 0.2 10*3/uL (ref 0.0–0.7)
Eosinophils Relative: 2 %
HCT: 44.9 % (ref 39.0–52.0)
Hemoglobin: 15 g/dL (ref 13.0–17.0)
Lymphocytes Relative: 50 %
Lymphs Abs: 4.1 10*3/uL — ABNORMAL HIGH (ref 0.7–4.0)
MCH: 31.3 pg (ref 26.0–34.0)
MCHC: 33.4 g/dL (ref 30.0–36.0)
MCV: 93.5 fL (ref 78.0–100.0)
Monocytes Absolute: 0.7 10*3/uL (ref 0.1–1.0)
Monocytes Relative: 8 %
Neutro Abs: 3.3 10*3/uL (ref 1.7–7.7)
Neutrophils Relative %: 40 %
Platelets: 180 10*3/uL (ref 150–400)
RBC: 4.8 MIL/uL (ref 4.22–5.81)
RDW: 13.8 % (ref 11.5–15.5)
WBC: 8.2 10*3/uL (ref 4.0–10.5)

## 2016-05-12 LAB — URINALYSIS, ROUTINE W REFLEX MICROSCOPIC
Bilirubin Urine: NEGATIVE
Glucose, UA: NEGATIVE mg/dL
Hgb urine dipstick: NEGATIVE
Ketones, ur: NEGATIVE mg/dL
Leukocytes, UA: NEGATIVE
Nitrite: NEGATIVE
Protein, ur: NEGATIVE mg/dL
Specific Gravity, Urine: 1.017 (ref 1.005–1.030)
pH: 7 (ref 5.0–8.0)

## 2016-05-12 LAB — RAPID URINE DRUG SCREEN, HOSP PERFORMED
Amphetamines: NOT DETECTED
Barbiturates: NOT DETECTED
Benzodiazepines: NOT DETECTED
Cocaine: NOT DETECTED
Opiates: NOT DETECTED
Tetrahydrocannabinol: POSITIVE — AB

## 2016-05-12 LAB — COMPREHENSIVE METABOLIC PANEL
ALT: 12 U/L — ABNORMAL LOW (ref 17–63)
AST: 18 U/L (ref 15–41)
Albumin: 3.8 g/dL (ref 3.5–5.0)
Alkaline Phosphatase: 54 U/L (ref 38–126)
Anion gap: 9 (ref 5–15)
BUN: 10 mg/dL (ref 6–20)
CO2: 29 mmol/L (ref 22–32)
Calcium: 9 mg/dL (ref 8.9–10.3)
Chloride: 102 mmol/L (ref 101–111)
Creatinine, Ser: 0.94 mg/dL (ref 0.61–1.24)
GFR calc Af Amer: >60 mL/min (ref >60)
GFR calc non Af Amer: >60 mL/min (ref >60)
Glucose, Bld: 86 mg/dL (ref 65–99)
Potassium: 3.8 mmol/L (ref 3.5–5.1)
Sodium: 140 mmol/L (ref 135–145)
Total Bilirubin: 0.5 mg/dL (ref 0.3–1.2)
Total Protein: 6.3 g/dL — ABNORMAL LOW (ref 6.5–8.1)

## 2016-05-12 LAB — ETHANOL: Alcohol, Ethyl (B): <5 mg/dL (ref <5)

## 2016-05-12 LAB — ACETAMINOPHEN LEVEL: Acetaminophen (Tylenol), Serum: <10 ug/mL — ABNORMAL LOW (ref 10–30)

## 2016-05-12 LAB — SALICYLATE LEVEL: Salicylate Lvl: <4 mg/dL (ref 2.8–30.0)

## 2016-05-12 MED ORDER — NICOTINE 21 MG/24HR TD PT24
21.0000 mg | MEDICATED_PATCH | Freq: Once | TRANSDERMAL | Status: AC
  Administered 2016-05-12: 21 mg via TRANSDERMAL
  Filled 2016-05-12: qty 1

## 2016-05-12 MED ORDER — NAPROXEN 250 MG PO TABS
500.0000 mg | ORAL_TABLET | Freq: Two times a day (BID) | ORAL | Status: DC
  Administered 2016-05-12 – 2016-05-13 (×4): 500 mg via ORAL
  Filled 2016-05-12 (×4): qty 2

## 2016-05-12 MED ORDER — NAPROXEN 250 MG PO TABS: 500.0000 mg | ORAL_TABLET | Freq: Two times a day (BID) | ORAL | Status: DC

## 2016-05-12 NOTE — ED Notes (Signed)
Pt.'s personal belongings given to parents to take home .

## 2016-05-12 NOTE — ED Notes (Signed)
3PM snack given. 

## 2016-05-12 NOTE — ED Notes (Signed)
Patient was given a snack and drink, a regular diet ordered for lunch. 

## 2016-05-12 NOTE — ED Notes (Signed)
Pt given 9 pm snack.

## 2016-05-12 NOTE — ED Notes (Signed)
TTS machine placed at bedside.   
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
## 2016-05-12 NOTE — ED Notes (Signed)
Lunch tray at bedside. ?

## 2016-05-12 NOTE — ED Notes (Signed)
Called pharmacy and they will change time on the Naproxyn.
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
## 2016-05-12 NOTE — ED Notes (Signed)
[facility], Gearldine BienenstockBrandy advised that she does not think there will be a bed available for patient today.

## 2016-05-13 ENCOUNTER — Encounter (HOSPITAL_COMMUNITY): Payer: Self-pay | Admitting: Family

## 2016-05-13 DIAGNOSIS — F17210 Nicotine dependence, cigarettes, uncomplicated: Secondary | ICD-10-CM

## 2016-05-13 DIAGNOSIS — Z818 Family history of other mental and behavioral disorders: Secondary | ICD-10-CM

## 2016-05-13 DIAGNOSIS — R45851 Suicidal ideations: Secondary | ICD-10-CM

## 2016-05-13 DIAGNOSIS — F1010 Alcohol abuse, uncomplicated: Secondary | ICD-10-CM | POA: Diagnosis present

## 2016-05-13 DIAGNOSIS — F332 Major depressive disorder, recurrent severe without psychotic features: Secondary | ICD-10-CM

## 2016-05-13 MED ORDER — NICOTINE 21 MG/24HR TD PT24
21.0000 mg | MEDICATED_PATCH | Freq: Once | TRANSDERMAL | Status: DC
  Administered 2016-05-13: 21 mg via TRANSDERMAL
  Filled 2016-05-13: qty 1

## 2016-05-13 NOTE — ED Provider Notes (Signed)
[facility]. Patient's transfer paperwork completed including Laurelyn SickleEMTALLA   Araminta Zorn, MD 05/13/16 2338
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
## 2016-05-13 NOTE — Progress Notes (Signed)
[facility] called to advise that [facility] was accepted [facility] [place] [facility], to Dr. Robet Leuhotakura, can call report this evening at [phone], bed is ready. RN Arlys JohnBrian has been informed. Patient willing to go volutary. Per RN Arlys JohnBrian, [facility]. [facility].  Thomas Bird, LCSWA Disposition staff 05/13/2016 11:04 PM

## 2016-05-13 NOTE — ED Notes (Signed)
Pt taken to shower room appears in no distress cooperative

## 2016-05-13 NOTE — ED Notes (Signed)
Patient was given a snack and drink, and a regular diet ordered for lunch. 

## 2016-05-13 NOTE — ED Notes (Addendum)
Behavioral health counselor now speaking with patient via telepsych

## 2016-05-13 NOTE — Progress Notes (Signed)
Referred pt to the following for inpatient treatment at recommendation of [facility] per [facility]- per Charl[facility] Boxer[facility] Left voicemail with Turner DanielsRowan, will refer if there is bed availability.  Ilean SkillMeghan Asuna Peth, MSW, LCSW Clinical Social Work, Disposition  05/13/2016 [phone]

## 2016-05-14 NOTE — ED Notes (Signed)
[facility]  [facility].  Patient in no signs of distress at this time.

## 2017-08-31 ENCOUNTER — Other Ambulatory Visit: Payer: Self-pay

## 2017-08-31 ENCOUNTER — Encounter (HOSPITAL_COMMUNITY): Payer: Self-pay | Admitting: Emergency Medicine

## 2017-08-31 DIAGNOSIS — K610 Anal abscess: Secondary | ICD-10-CM | POA: Insufficient documentation

## 2017-08-31 DIAGNOSIS — F17210 Nicotine dependence, cigarettes, uncomplicated: Secondary | ICD-10-CM | POA: Insufficient documentation

## 2017-08-31 NOTE — ED Notes (Signed)
Pt remains in waiting room. Updated on wait for treatment room. 

## 2017-08-31 NOTE — ED Triage Notes (Signed)
Pt c/o abscess "the size of my thumb" in his rectum. Pt reports first noticing the area this morning. Hx of abscesses.

## 2017-09-01 ENCOUNTER — Emergency Department (HOSPITAL_COMMUNITY)
Admission: EM | Admit: 2017-09-01 | Discharge: 2017-09-01 | Disposition: A | Discharge status: Home / Self Care | Payer: Self-pay | Attending: Emergency Medicine | Admitting: Emergency Medicine

## 2017-09-01 ENCOUNTER — Emergency Department (HOSPITAL_COMMUNITY): Payer: Self-pay

## 2017-09-01 DIAGNOSIS — K610 Anal abscess: Secondary | ICD-10-CM

## 2017-09-01 LAB — CBC WITH DIFFERENTIAL/PLATELET
Basophils Absolute: 0 10*3/uL (ref 0.0–0.1)
Basophils Relative: 0 %
Eosinophils Absolute: 0.2 10*3/uL (ref 0.0–0.7)
Eosinophils Relative: 2 %
HCT: 40.9 % (ref 39.0–52.0)
Hemoglobin: 13.9 g/dL (ref 13.0–17.0)
Lymphocytes Relative: 41 %
Lymphs Abs: 3.7 10*3/uL (ref 0.7–4.0)
MCH: 31.3 pg (ref 26.0–34.0)
MCHC: 34 g/dL (ref 30.0–36.0)
MCV: 92.1 fL (ref 78.0–100.0)
Monocytes Absolute: 0.8 10*3/uL (ref 0.1–1.0)
Monocytes Relative: 8 %
Neutro Abs: 4.5 10*3/uL (ref 1.7–7.7)
Neutrophils Relative %: 49 %
Platelets: 174 10*3/uL (ref 150–400)
RBC: 4.44 MIL/uL (ref 4.22–5.81)
RDW: 13.3 % (ref 11.5–15.5)
WBC: 9.2 10*3/uL (ref 4.0–10.5)

## 2017-09-01 LAB — BASIC METABOLIC PANEL
Anion gap: 11 (ref 5–15)
BUN: 11 mg/dL (ref 6–20)
CO2: 23 mmol/L (ref 22–32)
Calcium: 8.5 mg/dL — ABNORMAL LOW (ref 8.9–10.3)
Chloride: 106 mmol/L (ref 101–111)
Creatinine, Ser: 1.05 mg/dL (ref 0.61–1.24)
GFR calc Af Amer: >60 mL/min (ref >60)
GFR calc non Af Amer: >60 mL/min (ref >60)
Glucose, Bld: 79 mg/dL (ref 65–99)
Potassium: 3.5 mmol/L (ref 3.5–5.1)
Sodium: 140 mmol/L (ref 135–145)

## 2017-09-01 MED ORDER — ONDANSETRON HCL 4 MG/2ML IJ SOLN
4.0000 mg | Freq: Once | INTRAMUSCULAR | Status: AC
  Administered 2017-09-01: 4 mg via INTRAVENOUS
  Filled 2017-09-01: qty 2

## 2017-09-01 MED ORDER — HYDROMORPHONE HCL 1 MG/ML IJ SOLN
1.0000 mg | Freq: Once | INTRAMUSCULAR | Status: AC
  Administered 2017-09-01: 1 mg via INTRAVENOUS
  Filled 2017-09-01: qty 1

## 2017-09-01 MED ORDER — DOXYCYCLINE HYCLATE 100 MG PO CAPS: 100.0000 mg | ORAL_CAPSULE | Freq: Two times a day (BID) | ORAL | 0 refills | Status: DC

## 2017-09-01 MED ORDER — IOPAMIDOL (ISOVUE-300) INJECTION 61%
INTRAVENOUS | Status: AC
  Administered 2017-09-01: 50 mL
  Filled 2017-09-01: qty 100

## 2017-09-01 MED ORDER — HYDROCODONE-ACETAMINOPHEN 5-325 MG PO TABS
2.0000 | ORAL_TABLET | Freq: Once | ORAL | Status: AC
  Administered 2017-09-01: 2 via ORAL
  Filled 2017-09-01: qty 2

## 2017-09-01 MED ORDER — HYDROCODONE-ACETAMINOPHEN 5-325 MG PO TABS: 1.0000 | ORAL_TABLET | Freq: Four times a day (QID) | ORAL | 0 refills | Status: DC | PRN

## 2017-09-01 NOTE — Discharge Instructions (Signed)
Please soak the area in warm water 3-5 times daily and after bowel movement.  Take antibiotics as directed.  Return if the symptoms worsen.

## 2017-09-01 NOTE — ED Provider Notes (Signed)
[facility]   CSN: [id number] Arrival date & time: 08/31/17  1932     History   Chief Complaint Chief Complaint  Patient presents with  . Abscess    HPI Thomas Bird is a 47 y.o. male.  Patient presents to the ED with a chief complaint of rectal pain.  He states that he has an abscess forming.  He reports a history of the same.  He rates it as 10/10 pain that is worsened with movement, palpation, and walking.  He has not taken anything for the symptoms.  There are no other associated symptoms.  He denies fevers, chills, nausea, or vomiting.   The history is provided by the patient. No language interpreter was used.    Past Medical History:  Diagnosis Date  . Anxiety attack   . Back pain   . Bipolar 1 disorder (HCC)   . PTSD (post-traumatic stress disorder)     Patient Active Problem List   Diagnosis Date Noted  . 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 Alcohol abuse 05/13/2016    Past Surgical History:  Procedure Laterality Date  . FINGER SURGERY    . SHOULDER SURGERY  right       Home Medications    Prior to Admission medications   Not on File    Family History Family History  Problem Relation Age of Onset  . Depression Mother     Social History Social History   Tobacco Use  . Smoking status: Current Some Day Smoker    Types: Cigarettes  . Smokeless tobacco: Never Used  Substance Use Topics  . Alcohol use: Yes  . Drug use: Yes    Types: Marijuana     Allergies   Penicillins   Review of Systems Review of Systems  All other systems reviewed and are negative.    Physical Exam Updated Vital Signs BP (!) 114/94   Pulse 67   Temp 98 F (36.7 C) (Oral)   Resp 16   Ht 5\' 6"  (1.676 m)   Wt 72.6 kg (160 lb)   SpO2 95%   BMI 25.82 kg/m   Physical Exam  Constitutional: He is oriented to person, place, and time. No distress.    HENT:  Head: Normocephalic and atraumatic.  Eyes: Conjunctivae and EOM are normal. Pupils are equal, round, and reactive to light.  Neck: No tracheal deviation present.  Cardiovascular: Normal rate.  Pulmonary/Chest: Effort normal. No respiratory distress.  Abdominal: Soft.  Genitourinary:  Genitourinary Comments: Small indurated mass at 6 o'clock location of anus No visible hemorrhoids or fissures  Musculoskeletal: Normal range of motion.  Neurological: He is alert and oriented to person, place, and time.  Skin: Skin is warm and dry. He is not diaphoretic.  Psychiatric: Judgment normal.  Nursing note and vitals reviewed.    ED Treatments / Results  Labs (all labs ordered are listed, but only abnormal results are displayed) Labs Reviewed  BASIC METABOLIC PANEL - Abnormal; Notable for the following components:      Result Value   Calcium 8.5 (*)    All other components within normal limits  CBC WITH DIFFERENTIAL/PLATELET    EKG  EKG Interpretation None       Radiology Ct Pelvis W Contrast  Result Date: 09/01/2017 CLINICAL DATA:  Rectal abscess noted this morning. EXAM: CT PELVIS WITH CONTRAST TECHNIQUE: Multidetector CT imaging of the pelvis was performed using the  standard protocol following the bolus administration of intravenous contrast. CONTRAST:  50mL ISOVUE-300 IOPAMIDOL (ISOVUE-300) INJECTION 61% COMPARISON:  None. FINDINGS: Urinary Tract: Bladder wall is not thickened and no filling defects are identified. Bowel: Visualized small and large bowel are normal in caliber. No inflammatory changes. There is a tiny perianal fluid collection with enhancing wall measuring 7 mm in diameter. This could represent a localized abscess. No perianal infiltration or fistula identified. Vascular/Lymphatic: No significant lymphadenopathy. Iliac vessels are normal in caliber and appear patent. Reproductive:  Prostate gland is not enlarged. Other:  No free air or free fluid in the pelvis.  Musculoskeletal: Pelvis, sacrum, and hips appear intact. IMPRESSION: 7 mm perianal fluid collection likely represents an abscess. No infiltration or fistula identified. Electronically Signed   By: Burman Nieves M.D.   On: 09/01/2017 04:40    Procedures Procedures (including critical care time)  Medications Ordered in ED Medications  HYDROmorphone (DILAUDID) injection 1 mg (not administered)  ondansetron (ZOFRAN) injection 4 mg (not administered)     Initial Impression / Assessment and Plan / ED Course  I have reviewed the triage vital signs and the nursing notes.  Pertinent labs & imaging results that were available during my care of the patient were reviewed by me and considered in my medical decision making (see chart for details).     Patient with possible perinal/perirectal abscess.  Will treat pain, check labs, and get CT pelvis.  CT pelvis shows small perianal fluid collection measuring 7 mm.  I discussed the case with Dr. Rhunette Croft, who recommends consultation with general surgery.  I discussed case with Dr. Lindie Spruce, who states that the fluid collection is so small right now that he would recommend warm compresses, sitz baths, and antibiotic trial.  I discussed this plan with the patient.  He is agreeable.  He understands that he will need to come back if the abscess worsens.  Final Clinical Impressions(s) / ED Diagnoses   Final diagnoses:  Perianal abscess    ED Discharge Orders        Ordered    HYDROcodone-acetaminophen (NORCO/VICODIN) 5-325 MG tablet  Every 6 hours PRN     09/01/17 0512    doxycycline (VIBRAMYCIN) 100 MG capsule  2 times daily     09/01/17 0512       Roxy Horseman, PA-C 09/01/17 1610    Derwood Kaplan, MD 09/01/17 1152
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
## 2018-05-23 ENCOUNTER — Other Ambulatory Visit: Payer: Self-pay

## 2018-05-23 ENCOUNTER — Encounter (HOSPITAL_COMMUNITY): Payer: Self-pay | Admitting: *Deleted

## 2018-05-23 ENCOUNTER — Emergency Department (HOSPITAL_COMMUNITY)
Admission: EM | Admit: 2018-05-23 | Discharge: 2018-05-23 | Disposition: A | Discharge status: Home / Self Care | Payer: Self-pay | Attending: Emergency Medicine | Admitting: Emergency Medicine

## 2018-05-23 ENCOUNTER — Emergency Department (HOSPITAL_COMMUNITY): Payer: Self-pay

## 2018-05-23 DIAGNOSIS — M25561 Pain in right knee: Secondary | ICD-10-CM | POA: Insufficient documentation

## 2018-05-23 DIAGNOSIS — F17210 Nicotine dependence, cigarettes, uncomplicated: Secondary | ICD-10-CM | POA: Insufficient documentation

## 2018-05-23 MED ORDER — OXYCODONE-ACETAMINOPHEN 5-325 MG PO TABS: 1.0000 | ORAL_TABLET | ORAL | 0 refills | Status: DC | PRN

## 2018-05-23 MED ORDER — OXYCODONE-ACETAMINOPHEN 5-325 MG PO TABS
1.0000 | ORAL_TABLET | Freq: Once | ORAL | Status: AC
  Administered 2018-05-23: 1 via ORAL
  Filled 2018-05-23: qty 1
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
## 2018-05-23 NOTE — Discharge Instructions (Addendum)
Thank you for allowing me to care for you today in the Emergency Department.   Use the crutches and wear the knee immobilizer anytime you are standing or walking until you are seen by orthopedics.  Please call Dr. Magdalene Patricia office at [facility] to schedule a follow-up appointment.  For mild to moderate pain, take 650 mg of Tylenol or 600 mg of ibuprofen with food every 6 hours.  For severe pain, you can take 1 tablet of Percocet every 8 hours as needed.  Do not work or drive while taking this medication because it can cause you to be impaired.  It is also a narcotic and can be addicting. Apply ice for 15 to 20 minutes with an ice pack to right knee to help with pain and swelling.   Return to the emergency department if you have any fall or injury, if you develop fever chills, if you become unable to walk because your knee gives out, or if you develop other new, concerning symptoms.
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
## 2018-05-23 NOTE — ED Notes (Signed)
Patient has lump to right, lateral leg. This occurred 4 years ago when he fell 15 ft.

## 2019-05-31 ENCOUNTER — Emergency Department (HOSPITAL_COMMUNITY)
Admission: EM | Admit: 2019-05-31 | Discharge: 2019-05-31 | Disposition: A | Discharge status: Home / Self Care | Payer: Self-pay | Attending: Emergency Medicine | Admitting: Emergency Medicine

## 2019-05-31 ENCOUNTER — Other Ambulatory Visit: Payer: Self-pay

## 2019-05-31 ENCOUNTER — Encounter (HOSPITAL_COMMUNITY): Payer: Self-pay | Admitting: Emergency Medicine

## 2019-05-31 DIAGNOSIS — M5441 Lumbago with sciatica, right side: Secondary | ICD-10-CM | POA: Insufficient documentation

## 2019-05-31 DIAGNOSIS — F17210 Nicotine dependence, cigarettes, uncomplicated: Secondary | ICD-10-CM | POA: Insufficient documentation

## 2019-05-31 DIAGNOSIS — G8929 Other chronic pain: Secondary | ICD-10-CM | POA: Insufficient documentation

## 2019-05-31 MED ORDER — PREDNISONE 10 MG (21) PO TBPK: ORAL_TABLET | ORAL | 0 refills | Status: DC

## 2019-05-31 MED ORDER — LIDOCAINE 5 % EX PTCH: 1.0000 | MEDICATED_PATCH | CUTANEOUS | 0 refills | Status: DC

## 2019-05-31 MED ORDER — METHOCARBAMOL 500 MG PO TABS: 500.0000 mg | ORAL_TABLET | Freq: Two times a day (BID) | ORAL | 0 refills | Status: DC

## 2019-05-31 MED ORDER — KETOROLAC TROMETHAMINE 60 MG/2ML IM SOLN
30.0000 mg | Freq: Once | INTRAMUSCULAR | Status: AC
  Administered 2019-05-31: 30 mg via INTRAMUSCULAR
  Filled 2019-05-31: qty 2

## 2019-05-31 MED ORDER — PREDNISONE 50 MG PO TABS
60.0000 mg | ORAL_TABLET | Freq: Once | ORAL | Status: AC
  Administered 2019-05-31: 60 mg via ORAL
  Filled 2019-05-31: qty 1

## 2019-05-31 MED ORDER — METHOCARBAMOL 500 MG PO TABS
500.0000 mg | ORAL_TABLET | Freq: Once | ORAL | Status: AC
  Administered 2019-05-31: 500 mg via ORAL
  Filled 2019-05-31: qty 1

## 2019-05-31 NOTE — Discharge Instructions (Signed)
°  Take it easy, but do not lay around too much as this may make any stiffness worse.  °Antiinflammatory medications: Take 600 mg of ibuprofen every 6 hours or 440 mg (over the counter dose) to 500 mg (prescription dose) of naproxen every 12 hours for the next 3 days. After this time, these medications may be used as needed for pain. Take these medications with food to avoid upset stomach. Choose only one of these medications, do not take them together. °Acetaminophen (generic for Tylenol): Should you continue to have additional pain while taking the ibuprofen or naproxen, you may add in acetaminophen as needed. Your daily total maximum amount of acetaminophen from all sources should be limited to 4000mg/day for persons without liver problems, or 2000mg/day for those with liver problems. °Methocarbamol: Methocarbamol (generic for Robaxin) is a muscle relaxer and can help relieve stiff muscles or muscle spasms.  Do not drive or perform other dangerous activities while taking this medication as it can cause drowsiness as well as changes in reaction time and judgement. °Lidocaine patches: These are available via either prescription or over-the-counter. The over-the-counter option may be more economical one and are likely just as effective. There are multiple over-the-counter brands, such as Salonpas. °Exercises: Be sure to perform the attached exercises starting with three times a week and working up to performing them daily. This is an essential part of preventing long term problems.  °Follow up: Follow up with a primary care provider for any future management of these complaints. Be sure to follow up within 7-10 days. °Return: Return to the ED should symptoms worsen. ° °For prescription assistance, may try using prescription discount sites or apps, such as goodrx.com °

## 2019-05-31 NOTE — ED Triage Notes (Signed)
Patient states he is having back pain. Patient states that he possibly hurt his back but does not know exactly when this happen. Pain started at 3 am this morning. Pain in the lower back.

## 2019-05-31 NOTE — ED Provider Notes (Signed)
[facility] EMERGENCY DEPARTMENT Provider Note   CSN: [id number] Arrival date & time: 05/31/19  2038     History   Chief Complaint Chief Complaint  Patient presents with  . Back Pain    HPI Thomas Bird is a 48 y.o. male.     HPI   Thomas Bird is a 48 y.o. male, with a history of chronic back pain, presenting to the ED with pain in the right lower back beginning this morning.  He has had similar pain in this location in the past.  Pain feels like a tightness moderate to severe, radiating into the buttocks and upper right leg.  Denies immunocompromise or IV drug abuse. Denies fever/chills, trauma/falls, numbness, weakness, changes in bowel or bladder function, saddle anesthesias, urinary symptoms, abdominal pain, or any other complaints.    Past Medical History:  Diagnosis Date  . Anxiety attack   . Back pain   . Bipolar 1 disorder ([place])   . PTSD (post-traumatic stress disorder)     Patient Active Problem List   Diagnosis Date Noted  . 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 Alcohol abuse 05/13/2016    Past Surgical History:  Procedure Laterality Date  . FINGER SURGERY    . SHOULDER SURGERY  right        Home Medications    Prior to Admission medications   Medication Sig Start Date End Date Taking? Authorizing Provider  lidocaine (LIDODERM) 5 % Place 1 patch onto the skin daily. Remove & Discard patch within 12 hours or as directed by MD 05/31/19   Arsalan Brisbin C, PA-C  methocarbamol (ROBAXIN) 500 MG tablet Take 1 tablet (500 mg total) by mouth 2 (two) times daily. 05/31/19   Uma Jerde C, PA-C  predniSONE (STERAPRED UNI-PAK 21 TAB) 10 MG (21) TBPK tablet Take 6 tabs (60mg ) day 1, 5 tabs (50mg ) day 2, 4 tabs (40mg ) day 3, 3 tabs (30mg ) day 4, 2 tabs (20mg ) day 5, and 1 tab (10mg ) day 6. 05/31/19   Laycie Schriner, Helane Gunther, PA-C    Family History Family History  Problem Relation Age of Onset  . Depression Mother      Social History Social History   Tobacco Use  . Smoking status: Current Some Day Smoker    Types: Cigarettes  . Smokeless tobacco: Never Used  Substance Use Topics  . Alcohol use: Yes  . Drug use: Yes    Types: Marijuana     Allergies   Penicillins   Review of Systems Review of Systems  Constitutional: Negative for chills, diaphoresis and fever.  Respiratory: Negative for shortness of breath.   Cardiovascular: Negative for chest pain.  Gastrointestinal: Negative for abdominal pain, blood in stool, constipation, diarrhea, nausea and vomiting.  Genitourinary: Negative for decreased urine volume, difficulty urinating, dysuria, flank pain, hematuria and testicular pain.  Musculoskeletal: Positive for back pain.  Neurological: Negative for weakness and numbness.  All other systems reviewed and are negative.    Physical Exam Updated Vital Signs BP 133/82 (BP Location: Right Arm)   Temp 97.8 F (36.6 C) (Oral)   Resp 18   Ht 5\' 7"  (1.702 m)   Wt 68 kg   SpO2 100%   BMI 23.49 kg/m   Physical Exam Vitals signs and nursing note reviewed.  Constitutional:      General: He is not in acute distress.    Appearance: He is well-developed. He is not diaphoretic.  HENT:  Head: Normocephalic and atraumatic.     Mouth/Throat:     Mouth: Mucous membranes are moist.     Pharynx: Oropharynx is clear.  Eyes:     Conjunctiva/sclera: Conjunctivae normal.  Neck:     Musculoskeletal: Neck supple.  Cardiovascular:     Rate and Rhythm: Normal rate and regular rhythm.     Pulses: Normal pulses.          Radial pulses are 2+ on the right side and 2+ on the left side.       Posterior tibial pulses are 2+ on the right side and 2+ on the left side.     Heart sounds: Normal heart sounds.     Comments: Tactile temperature in the extremities appropriate and equal bilaterally. Pulmonary:     Effort: Pulmonary effort is normal. No respiratory distress.     Breath sounds: Normal  breath sounds.  Abdominal:     Palpations: Abdomen is soft.     Tenderness: There is no abdominal tenderness. There is no right CVA tenderness, left CVA tenderness or guarding.  Musculoskeletal:     Lumbar back: He exhibits tenderness. He exhibits no bony tenderness.       Back:     Right lower leg: No edema.     Left lower leg: No edema.     Comments: Normal motor function intact in all extremities. No midline spinal tenderness.   Lymphadenopathy:     Cervical: No cervical adenopathy.  Skin:    General: Skin is warm and dry.     Coloration: Skin is not pale.  Neurological:     Mental Status: He is alert.     Comments: Sensation grossly intact to light touch in the lower extremities bilaterally. No saddle anesthesias. Strength 5/5 in the bilateral lower extremities. No noted gait deficit.  Psychiatric:        Mood and Affect: Mood and affect normal.        Speech: Speech normal.        Behavior: Behavior normal.      ED Treatments / Results  Labs (all labs ordered are listed, but only abnormal results are displayed) Labs Reviewed - No data to display  EKG None  Radiology No results found.  Procedures Procedures (including critical care time)  Medications Ordered in ED Medications  ketorolac (TORADOL) injection 30 mg (30 mg Intramuscular Given 05/31/19 2345)  predniSONE (DELTASONE) tablet 60 mg (60 mg Oral Given 05/31/19 2344)  methocarbamol (ROBAXIN) tablet 500 mg (500 mg Oral Given 05/31/19 2344)     Initial Impression / Assessment and Plan / ED Course  I have reviewed the triage vital signs and the nursing notes.  Pertinent labs & imaging results that were available during my care of the patient were reviewed by me and considered in my medical decision making (see chart for details).        Patient presents with acute on chronic lower back pain.  History elements and physical exam findings do not suggest neurosurgical emergency. The patient was given  instructions for home care as well as return precautions. Patient voices understanding of these instructions, accepts the plan, and is comfortable with discharge.  Final Clinical Impressions(s) / ED Diagnoses   Final diagnoses:  Chronic right-sided low back pain with right-sided sciatica    ED Discharge Orders         Ordered    methocarbamol (ROBAXIN) 500 MG tablet  2 times daily     05/31/19  2326    lidocaine (LIDODERM) 5 %  Every 24 hours     05/31/19 2326    predniSONE (STERAPRED UNI-PAK 21 TAB) 10 MG (21) TBPK tablet     05/31/19 2354           Anselm PancoastJoy, Sharlyn Odonnel C, PA-C 06/02/19 1651    Glynn Octaveancour, Stephen, MD 06/03/19 [phone]

## 2019-06-15 ENCOUNTER — Emergency Department (HOSPITAL_COMMUNITY): Payer: Self-pay

## 2019-06-15 ENCOUNTER — Other Ambulatory Visit: Payer: Self-pay

## 2019-06-15 ENCOUNTER — Encounter (HOSPITAL_COMMUNITY): Payer: Self-pay | Admitting: Emergency Medicine

## 2019-06-15 ENCOUNTER — Emergency Department (HOSPITAL_COMMUNITY)
Admission: EM | Admit: 2019-06-15 | Discharge: 2019-06-15 | Disposition: A | Discharge status: Home / Self Care | Payer: Self-pay | Attending: Emergency Medicine | Admitting: Emergency Medicine

## 2019-06-15 DIAGNOSIS — F17210 Nicotine dependence, cigarettes, uncomplicated: Secondary | ICD-10-CM | POA: Insufficient documentation

## 2019-06-15 DIAGNOSIS — M545 Low back pain, unspecified: Secondary | ICD-10-CM

## 2019-06-15 MED ORDER — NAPROXEN 250 MG PO TABS
500.0000 mg | ORAL_TABLET | Freq: Once | ORAL | Status: AC
  Administered 2019-06-15: 500 mg via ORAL
  Filled 2019-06-15: qty 2

## 2019-06-15 MED ORDER — NAPROXEN 500 MG PO TABS: 500.0000 mg | ORAL_TABLET | Freq: Two times a day (BID) | ORAL | 0 refills | Status: DC | PRN

## 2019-06-15 MED ORDER — DEXAMETHASONE SODIUM PHOSPHATE 10 MG/ML IJ SOLN
10.0000 mg | Freq: Once | INTRAMUSCULAR | Status: AC
  Administered 2019-06-15: 10 mg via INTRAMUSCULAR
  Filled 2019-06-15: qty 1

## 2019-06-15 NOTE — ED Triage Notes (Signed)
Pt c/o lower, center back pain x 1 week after feeling "pop", pt reports he was seen here for same x 1 week ago

## 2019-06-15 NOTE — ED Provider Notes (Signed)
[facility] EMERGENCY DEPARTMENT Provider Note   CSN: [id number] Arrival date & time: 06/15/19  7371     History   Chief Complaint Chief Complaint  Patient presents with  . Back Pain    HPI Thomas Bird is a 48 y.o. male.     Patient presents with worsening back pain lumbar region for the past week.  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  No direct trauma or fall.  No weakness or numbness or challenges with bowel or bladder function.  No back surgery history however patient's had an injection for back pain in the past.  Patient denies IV drug abuse however uses cigarettes.  Worse with movement.  Worse in the right lower.  No focal radiation at this time.     Past Medical History:  Diagnosis Date  . Anxiety attack   . Back pain   . Bipolar 1 disorder ([place])   . PTSD (post-traumatic stress disorder)     Patient Active Problem List   Diagnosis Date Noted  . 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 Alcohol abuse 05/13/2016    Past Surgical History:  Procedure Laterality Date  . FINGER SURGERY    . SHOULDER SURGERY  right        Home Medications    Prior to Admission medications   Medication Sig Start Date End Date Taking? Authorizing Provider  lidocaine (LIDODERM) 5 % Place 1 patch onto the skin daily. Remove & Discard patch within 12 hours or as directed by MD 05/31/19   Joy, Shawn C, PA-C  methocarbamol (ROBAXIN) 500 MG tablet Take 1 tablet (500 mg total) by mouth 2 (two) times daily. 05/31/19   Joy, Shawn C, PA-C  predniSONE (STERAPRED UNI-PAK 21 TAB) 10 MG (21) TBPK tablet Take 6 tabs (60mg ) day 1, 5 tabs (50mg ) day 2, 4 tabs (40mg ) day 3, 3 tabs (30mg ) day 4, 2 tabs (20mg ) day 5, and 1 tab (10mg ) day 6. 05/31/19   Joy, Helane Gunther, PA-C    Family History Family History  Problem Relation Age of Onset  . Depression Mother     Social History Social History   Tobacco Use  . Smoking  status: Current Some Day Smoker    Types: Cigarettes  . Smokeless tobacco: Never Used  Substance Use Topics  . Alcohol use: Yes  . Drug use: Yes    Types: Marijuana     Allergies   Penicillins   Review of Systems Review of Systems  Constitutional: Negative for chills and fever.  Gastrointestinal: Negative for abdominal pain and vomiting.  Genitourinary: Negative for dysuria and flank pain.  Musculoskeletal: Positive for back pain. Negative for neck pain and neck stiffness.  Skin: Negative for rash.  Neurological: Negative for weakness, light-headedness, numbness and headaches.     Physical Exam Updated Vital Signs BP 126/85 (BP Location: Right Arm)   Pulse 65   Temp 98.1 F (36.7 C) (Oral)   Resp 17   Ht 5\' 7"  (1.702 m)   Wt 72.6 kg   SpO2 98%   BMI 25.06 kg/m   Physical Exam Vitals signs and nursing note reviewed.  Constitutional:      Appearance: He is well-developed.  HENT:     Head: Normocephalic and atraumatic.  Eyes:     General:        Right eye: No discharge.        Left eye: No discharge.  Neck:     Musculoskeletal: Normal range of motion and neck supple.     Trachea: No tracheal deviation.  Cardiovascular:     Rate and Rhythm: Normal rate.  Pulmonary:     Effort: Pulmonary effort is normal.  Abdominal:     General: There is no distension.     Palpations: Abdomen is soft.  Musculoskeletal:        General: Tenderness present. No swelling.     Comments: Patient has normal 5+ strength flexion extension of major joints and lower extremity sensation intact to palpation bilateral lower extremities.  Patient can walk with discomfort.  Patient has tenderness midline and paraspinal mid lumbar region.  Skin:    General: Skin is warm.     Findings: No rash.  Neurological:     Mental Status: He is alert and oriented to person, place, and time.      ED Treatments / Results  Labs (all labs ordered are listed, but only abnormal results are displayed)  Labs Reviewed - No data to display  EKG None  Radiology Dg Lumbar Spine 2-3 Views  Result Date: 06/15/2019 CLINICAL DATA:  Back pain EXAM: LUMBAR SPINE - 2-3 VIEW COMPARISON:  05/11/2016 FINDINGS: Lumbar alignment is normal. Vertebral body heights are normal. Mild degenerative changes at L1-L2 and L5-S1. IMPRESSION: Mild degenerative changes.  No acute osseous abnormality Electronically Signed   By: Jasmine Pang M.D.   On: 06/15/2019 21:03    Procedures Procedures (including critical care time)  Medications Ordered in ED Medications  dexamethasone (DECADRON) injection 10 mg (10 mg Intramuscular Given 06/15/19 2036)  naproxen (NAPROSYN) tablet 500 mg (500 mg Oral Given 06/15/19 2036)     Initial Impression / Assessment and Plan / ED Course  I have reviewed the triage vital signs and the nursing notes.  Pertinent labs & imaging results that were available during my care of the patient were reviewed by me and considered in my medical decision making (see chart for details).       Patient presents with clinically musculoskeletal back pain however with worsening symptoms and because he felt a pop with midline tenderness plan for screening lumbar x-rays.  Discussed patient will need follow-up with primary doctor/orthopedics.  Discussed narcotics not indicated however will give steroid shot and Tylenol.  Patient explains that over-the-counter medications do not work for him due to high pain tolerance. X-ray no acute fracture.  Patient stable for outpatient follow-up. Final Clinical Impressions(s) / ED Diagnoses   Final diagnoses:  Lumbar pain    ED Discharge Orders    None       Blane Ohara, MD 06/15/19 2227

## 2019-06-15 NOTE — Discharge Instructions (Signed)
Follow-up with orthopedic doctor for further evaluation and treatment options. Return to the emergency room for weakness, persistent numbness, difficulty with bowel or bladder function, fevers or new concerns. Take Tylenol and ibuprofen at the same time every 6 hours along with heat or ice pack. The steroid shot we gave you today should last approximately 3 days.

## 2019-06-20 ENCOUNTER — Telehealth: Payer: Self-pay | Admitting: Orthopedic Surgery

## 2019-06-20 NOTE — Telephone Encounter (Signed)
Non fractures can be scheduled when we have a new patient opening

## 2019-06-20 NOTE — Telephone Encounter (Signed)
Patient called to relay he was seen at [facility] Emergency room 06/15/19; had Xrays at this visit; also seen there on 05/31/19(Dr Aline Brochure not on call on this date) - for problem of low back pain. Patient states he is trying to get an MRI done. Discussed whether he is seeing primary care doctor or any other providers, such as back specialist for this issue, and he stated he saw a doctor about 10 years ago at [place]. States he is self-pay, and is trying to find out "how bad his back is."  Please review and advise if to schedule office visit.

## 2019-06-21 ENCOUNTER — Encounter: Payer: Self-pay | Admitting: Orthopedic Surgery

## 2019-06-21 NOTE — Telephone Encounter (Signed)
Tried again to reach patient - no answer or voice mail to leave message; sent letter.

## 2019-06-21 NOTE — Telephone Encounter (Signed)
Called back to patient to offer appointment. °

## 2019-07-02 ENCOUNTER — Telehealth: Payer: Self-pay | Admitting: Orthopedic Surgery

## 2019-07-02 NOTE — Telephone Encounter (Signed)
Patient called back,  call from 06/20/19, which we had attempted to return for the next 2 days. Offered appointment per Dr Ruthe Mannan reply. Questions answered regarding self-pay and amount requested at initial visit. Patient hung up in midst of conversation.

## 2019-07-21 ENCOUNTER — Encounter (HOSPITAL_COMMUNITY): Payer: Self-pay | Admitting: *Deleted

## 2019-07-21 ENCOUNTER — Emergency Department (HOSPITAL_COMMUNITY)
Admission: EM | Admit: 2019-07-21 | Discharge: 2019-07-21 | Disposition: A | Discharge status: Home / Self Care | Payer: Self-pay | Attending: Emergency Medicine | Admitting: Emergency Medicine

## 2019-07-21 ENCOUNTER — Other Ambulatory Visit: Payer: Self-pay

## 2019-07-21 DIAGNOSIS — F319 Bipolar disorder, unspecified: Secondary | ICD-10-CM | POA: Insufficient documentation

## 2019-07-21 DIAGNOSIS — G8929 Other chronic pain: Secondary | ICD-10-CM | POA: Insufficient documentation

## 2019-07-21 DIAGNOSIS — Z720 Tobacco use: Secondary | ICD-10-CM | POA: Insufficient documentation

## 2019-07-21 DIAGNOSIS — F4310 Post-traumatic stress disorder, unspecified: Secondary | ICD-10-CM | POA: Insufficient documentation

## 2019-07-21 DIAGNOSIS — M5441 Lumbago with sciatica, right side: Secondary | ICD-10-CM | POA: Insufficient documentation

## 2019-07-21 MED ORDER — METHOCARBAMOL 500 MG PO TABS
1000.0000 mg | ORAL_TABLET | Freq: Once | ORAL | Status: AC
  Administered 2019-07-21: 1000 mg via ORAL
  Filled 2019-07-21: qty 2

## 2019-07-21 MED ORDER — KETOROLAC TROMETHAMINE 15 MG/ML IJ SOLN: 15.0000 mg | Freq: Once | INTRAMUSCULAR | Status: DC

## 2019-07-21 MED ORDER — MELOXICAM 15 MG PO TABS: 15.0000 mg | ORAL_TABLET | Freq: Every day | ORAL | 0 refills | Status: DC

## 2019-07-21 MED ORDER — LIDOCAINE 5 % EX PTCH
1.0000 | MEDICATED_PATCH | CUTANEOUS | Status: DC
  Administered 2019-07-21: 22:00:00 1 via TRANSDERMAL
  Filled 2019-07-21: qty 1

## 2019-07-21 MED ORDER — METHOCARBAMOL 500 MG PO TABS: 500.0000 mg | ORAL_TABLET | Freq: Two times a day (BID) | ORAL | 0 refills | Status: DC

## 2019-07-21 MED ORDER — KETOROLAC TROMETHAMINE 60 MG/2ML IM SOLN
30.0000 mg | Freq: Once | INTRAMUSCULAR | Status: AC
  Administered 2019-07-21: 30 mg via INTRAMUSCULAR
  Filled 2019-07-21: qty 2

## 2019-07-21 NOTE — ED Triage Notes (Signed)
The pt is c/o lower back pain for 2 weeks   No know injury he was seen at [facility] earlier

## 2019-07-21 NOTE — Discharge Instructions (Addendum)
Take Meloxicam once daily for pain Take Robaxin as needed for muscle pain and spasms Try a heating pad for stiff/sore muscles You can also try lidocaine patches or topical rubs over the counter for pain Please follow up with Dr. Aline Brochure

## 2019-07-21 NOTE — ED Provider Notes (Signed)
[facility] EMERGENCY DEPARTMENT Provider Note   CSN: [id number] Arrival date & time: 07/21/19  1918     History Chief Complaint  Patient presents with  . Back Pain    Thomas Bird is a 48 y.o. male with chronic pain syndrome who presents with back pain.  He states that over the past week his lower back pain has been gradually worsening.  Is getting the point where it is difficult to walk.  Pain radiates down his right leg at times.  It is worse with prolonged periods of standing.  He reports having a "knot" over his low back which is worse when he stands up.  He has been seen for this problem in the past and has had x-rays of his back which shows mild degenerative changes.  He has not followed up with Dr. Aline Brochure with orthopedics yet but has been trying to get an appointment.  He has not been taking anything for pain. No fever, syncope, trauma, unexplained weight loss, hx of cancer, loss of bowel/bladder function, saddle anesthesia, urinary retention, IVDU. 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  HPI     Past Medical History:  Diagnosis Date  . Anxiety attack   . Back pain   . Bipolar 1 disorder ([place])   . PTSD (post-traumatic stress disorder)     Patient Active Problem List   Diagnosis Date Noted  . 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 Alcohol abuse 05/13/2016    Past Surgical History:  Procedure Laterality Date  . FINGER SURGERY    . SHOULDER SURGERY  right       Family History  Problem Relation Age of Onset  . Depression Mother     Social History   Tobacco Use  . Smoking status: Current Some Day Smoker    Types: Cigarettes  . Smokeless tobacco: Never Used  Substance Use Topics  . Alcohol use: Yes  . Drug use: Yes    Types: Marijuana    Home Medications Prior to Admission medications   Medication Sig Start Date End Date Taking? Authorizing  Provider  lidocaine (LIDODERM) 5 % Place 1 patch onto the skin daily. Remove & Discard patch within 12 hours or as directed by MD 05/31/19   Joy, Shawn C, PA-C  methocarbamol (ROBAXIN) 500 MG tablet Take 1 tablet (500 mg total) by mouth 2 (two) times daily. 05/31/19   Joy, Shawn C, PA-C  naproxen (NAPROSYN) 500 MG tablet Take 1 tablet (500 mg total) by mouth 2 (two) times daily as needed. 06/15/19   Elnora Morrison, MD  predniSONE (STERAPRED UNI-PAK 21 TAB) 10 MG (21) TBPK tablet Take 6 tabs (60mg ) day 1, 5 tabs (50mg ) day 2, 4 tabs (40mg ) day 3, 3 tabs (30mg ) day 4, 2 tabs (20mg ) day 5, and 1 tab (10mg ) day 6. 05/31/19   Joy, Shawn C, PA-C    Allergies    Penicillins  Review of Systems   Review of Systems  Constitutional: Negative for fever.  Musculoskeletal: Positive for back pain.  Neurological: Negative for weakness and numbness.    Physical Exam Updated Vital Signs BP 113/74   Pulse (!) 51   Temp 98 F (36.7 C)   Resp (!) 180   Ht 5\' 7"  (1.702 m)   Wt 68 kg   SpO2 97%   BMI 23.49 kg/m   Physical Exam Vitals and nursing note reviewed.  Constitutional:      General: He is not in acute distress.    Appearance: Normal appearance. He is well-developed. He is not ill-appearing.     Comments: Chronically ill-appearing male in no acute distress.  Calm and cooperative  HENT:     Head: Normocephalic and atraumatic.  Eyes:     General: No scleral icterus.       Right eye: No discharge.        Left eye: No discharge.     Conjunctiva/sclera: Conjunctivae normal.     Pupils: Pupils are equal, round, and reactive to light.  Cardiovascular:     Rate and Rhythm: Normal rate.  Pulmonary:     Effort: Pulmonary effort is normal. No respiratory distress.  Abdominal:     General: There is no distension.  Musculoskeletal:     Cervical back: Normal range of motion.     Comments: Back: Inspection: No masses, deformity, or rash Palpation: No midline tenderness. Lumbar paraspinal  muscles are prominent bilaterally and diffusely tender. Strength: 5/5 in lower extremities and normal plantar and dorsiflexion Sensation: Intact sensation with light touch in lower extremities bilaterally Reflexes: Patellar reflex is 2+ bilaterally SLR: Negative seated straight leg raise Gait: Normal gait   Skin:    General: Skin is warm and dry.  Neurological:     Mental Status: He is alert and oriented to person, place, and time.  Psychiatric:        Behavior: Behavior normal.     ED Results / Procedures / Treatments   Labs (all labs ordered are listed, but only abnormal results are displayed) Labs Reviewed - No data to display  EKG None  Radiology No results found.  Procedures Procedures (including critical care time)  Medications Ordered in ED Medications  lidocaine (LIDODERM) 5 % 1 patch (1 patch Transdermal Patch Applied 07/21/19 2214)  methocarbamol (ROBAXIN) tablet 1,000 mg (1,000 mg Oral Given 07/21/19 2214)  ketorolac (TORADOL) injection 30 mg (30 mg Intramuscular Given 07/21/19 2216)    ED Course  I have reviewed the triage vital signs and the nursing notes.  Pertinent labs & imaging results that were available during my care of the patient were reviewed by me and considered in my medical decision making (see chart for details).  48 year old male with chronic back pain presents with worsening back pain over the past week.  He reports a knot over his back which appears to be his paraspinal muscles which are tender.  X-rays were obtained which shows mild degenerative disc disease in the lumbar spine.  He states he has had injections in his back in the past for similar symptoms but I do not see any record of this.  Do not see any history of outpatient MRI. He will likely need this in the future but no red flags or neuro deficits that would require imaging tonight .We will give him lidocaine patch, IM Toradol, Robaxin here.  We will give him prescription for Mobic and  Robaxin at home.  Is encouraged to follow-up with Dr. Romeo Apple for ongoing management of his chronic pain.  MDM Rules/Calculators/A&P  Final Clinical Impression(s) / ED Diagnoses Final diagnoses:  Chronic right-sided low back pain with right-sided sciatica    Rx / DC Orders ED Discharge Orders    None       Bethel Born, PA-C 07/21/19 2250    Derwood Kaplan, MD 07/22/19 1244

## 2020-08-10 ENCOUNTER — Encounter (HOSPITAL_COMMUNITY): Payer: Self-pay | Admitting: Emergency Medicine

## 2020-08-10 ENCOUNTER — Other Ambulatory Visit: Payer: Self-pay

## 2020-08-10 ENCOUNTER — Emergency Department (HOSPITAL_COMMUNITY)
Admission: EM | Admit: 2020-08-10 | Discharge: 2020-08-10 | Disposition: A | Discharge status: Home / Self Care | Payer: Self-pay | Attending: Emergency Medicine | Admitting: Emergency Medicine

## 2020-08-10 DIAGNOSIS — M549 Dorsalgia, unspecified: Secondary | ICD-10-CM | POA: Insufficient documentation

## 2020-08-10 DIAGNOSIS — Z5321 Procedure and treatment not carried out due to patient leaving prior to being seen by health care provider: Secondary | ICD-10-CM | POA: Insufficient documentation

## 2020-08-10 DIAGNOSIS — G8929 Other chronic pain: Secondary | ICD-10-CM | POA: Insufficient documentation

## 2020-08-10 NOTE — ED Triage Notes (Signed)
Pt c/o chronic pain that worsening 2 days ago.

## 2020-12-12 ENCOUNTER — Emergency Department (HOSPITAL_COMMUNITY)
Admission: EM | Admit: 2020-12-12 | Discharge: 2020-12-16 | Disposition: A | Discharge status: Home / Self Care | Payer: Self-pay | Attending: Emergency Medicine | Admitting: Emergency Medicine

## 2020-12-12 ENCOUNTER — Emergency Department (HOSPITAL_COMMUNITY): Payer: Self-pay

## 2020-12-12 ENCOUNTER — Other Ambulatory Visit: Payer: Self-pay

## 2020-12-12 ENCOUNTER — Encounter (HOSPITAL_COMMUNITY): Payer: Self-pay | Admitting: *Deleted

## 2020-12-12 DIAGNOSIS — F3289 Other specified depressive episodes: Secondary | ICD-10-CM

## 2020-12-12 DIAGNOSIS — S8991XA Unspecified injury of right lower leg, initial encounter: Secondary | ICD-10-CM | POA: Insufficient documentation

## 2020-12-12 DIAGNOSIS — S8992XA Unspecified injury of left lower leg, initial encounter: Secondary | ICD-10-CM | POA: Insufficient documentation

## 2020-12-12 DIAGNOSIS — F1290 Cannabis use, unspecified, uncomplicated: Secondary | ICD-10-CM

## 2020-12-12 DIAGNOSIS — Y9355 Activity, bike riding: Secondary | ICD-10-CM | POA: Insufficient documentation

## 2020-12-12 DIAGNOSIS — S59902A Unspecified injury of left elbow, initial encounter: Secondary | ICD-10-CM | POA: Insufficient documentation

## 2020-12-12 DIAGNOSIS — F17210 Nicotine dependence, cigarettes, uncomplicated: Secondary | ICD-10-CM | POA: Insufficient documentation

## 2020-12-12 DIAGNOSIS — Y900 Blood alcohol level of less than 20 mg/100 ml: Secondary | ICD-10-CM | POA: Insufficient documentation

## 2020-12-12 DIAGNOSIS — Z20822 Contact with and (suspected) exposure to covid-19: Secondary | ICD-10-CM | POA: Insufficient documentation

## 2020-12-12 DIAGNOSIS — F332 Major depressive disorder, recurrent severe without psychotic features: Secondary | ICD-10-CM | POA: Diagnosis present

## 2020-12-12 DIAGNOSIS — R45851 Suicidal ideations: Secondary | ICD-10-CM

## 2020-12-12 DIAGNOSIS — F1011 Alcohol abuse, in remission: Secondary | ICD-10-CM

## 2020-12-12 LAB — COMPREHENSIVE METABOLIC PANEL
ALT: 20 U/L (ref 0–44)
AST: 25 U/L (ref 15–41)
Albumin: 3.6 g/dL (ref 3.5–5.0)
Alkaline Phosphatase: 65 U/L (ref 38–126)
Anion gap: 10 (ref 5–15)
BUN: 22 mg/dL — ABNORMAL HIGH (ref 6–20)
CO2: 23 mmol/L (ref 22–32)
Calcium: 8.1 mg/dL — ABNORMAL LOW (ref 8.9–10.3)
Chloride: 100 mmol/L (ref 98–111)
Creatinine, Ser: 1 mg/dL (ref 0.61–1.24)
GFR, Estimated: >60 mL/min (ref >60)
Glucose, Bld: 192 mg/dL — ABNORMAL HIGH (ref 70–99)
Potassium: 3.4 mmol/L — ABNORMAL LOW (ref 3.5–5.1)
Sodium: 133 mmol/L — ABNORMAL LOW (ref 135–145)
Total Bilirubin: 0.4 mg/dL (ref 0.3–1.2)
Total Protein: 6.1 g/dL — ABNORMAL LOW (ref 6.5–8.1)

## 2020-12-12 LAB — CBC WITH DIFFERENTIAL/PLATELET
Abs Immature Granulocytes: 0.05 10*3/uL (ref 0.00–0.07)
Basophils Absolute: 0 10*3/uL (ref 0.0–0.1)
Basophils Relative: 0 %
Eosinophils Absolute: 0.1 10*3/uL (ref 0.0–0.5)
Eosinophils Relative: 1 %
HCT: 38.1 % — ABNORMAL LOW (ref 39.0–52.0)
Hemoglobin: 12.8 g/dL — ABNORMAL LOW (ref 13.0–17.0)
Immature Granulocytes: 0 %
Lymphocytes Relative: 18 %
Lymphs Abs: 2.1 10*3/uL (ref 0.7–4.0)
MCH: 31.7 pg (ref 26.0–34.0)
MCHC: 33.6 g/dL (ref 30.0–36.0)
MCV: 94.3 fL (ref 80.0–100.0)
Monocytes Absolute: 0.9 10*3/uL (ref 0.1–1.0)
Monocytes Relative: 8 %
Neutro Abs: 8.5 10*3/uL — ABNORMAL HIGH (ref 1.7–7.7)
Neutrophils Relative %: 73 %
Platelets: 179 10*3/uL (ref 150–400)
RBC: 4.04 MIL/uL — ABNORMAL LOW (ref 4.22–5.81)
RDW: 13.9 % (ref 11.5–15.5)
WBC: 11.7 10*3/uL — ABNORMAL HIGH (ref 4.0–10.5)
nRBC: 0 % (ref 0.0–0.2)

## 2020-12-12 LAB — RAPID URINE DRUG SCREEN, HOSP PERFORMED
Amphetamines: NOT DETECTED
Barbiturates: NOT DETECTED
Benzodiazepines: NOT DETECTED
Cocaine: NOT DETECTED
Opiates: NOT DETECTED
Tetrahydrocannabinol: POSITIVE — AB

## 2020-12-12 LAB — ETHANOL: Alcohol, Ethyl (B): <10 mg/dL (ref <10)

## 2020-12-12 MED ORDER — DOXYCYCLINE HYCLATE 100 MG PO TABS
100.0000 mg | ORAL_TABLET | Freq: Two times a day (BID) | ORAL | Status: DC
  Administered 2020-12-12 – 2020-12-16 (×8): 100 mg via ORAL
  Filled 2020-12-12 (×8): qty 1

## 2020-12-12 NOTE — ED Provider Notes (Signed)
Patient does not appear to be responding to internal stimuli however he does appear depressed and very forlorn, will consult with psychiatry for evaluation, he is currently medically cleared   Eber Hong, MD 12/12/20 2000
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
## 2020-12-12 NOTE — ED Notes (Signed)
Pt given meal per request  

## 2020-12-12 NOTE — ED Triage Notes (Signed)
Pt stating he wants mental help; states he needs help with his meds, when asked if pt is SI he states at times; pt states he would OD on his sleeping pills

## 2020-12-13 DIAGNOSIS — R45851 Suicidal ideations: Secondary | ICD-10-CM

## 2020-12-13 DIAGNOSIS — F1011 Alcohol abuse, in remission: Secondary | ICD-10-CM

## 2020-12-13 DIAGNOSIS — F1290 Cannabis use, unspecified, uncomplicated: Secondary | ICD-10-CM

## 2020-12-13 MED ORDER — TRAZODONE HCL 50 MG PO TABS
150.0000 mg | ORAL_TABLET | Freq: Every day | ORAL | Status: DC
  Administered 2020-12-13 – 2020-12-16 (×3): 150 mg via ORAL
  Filled 2020-12-13 (×3): qty 3

## 2020-12-13 MED ORDER — NICOTINE 21 MG/24HR TD PT24
21.0000 mg | MEDICATED_PATCH | Freq: Once | TRANSDERMAL | Status: AC
  Administered 2020-12-13: 21 mg via TRANSDERMAL
  Filled 2020-12-13: qty 1

## 2020-12-13 MED ORDER — PRAZOSIN HCL 2 MG PO CAPS
2.0000 mg | ORAL_CAPSULE | Freq: Every day | ORAL | Status: DC
  Administered 2020-12-13 – 2020-12-16 (×3): 2 mg via ORAL
  Filled 2020-12-13 (×4): qty 1

## 2020-12-13 MED ORDER — HYDROXYZINE HCL 25 MG PO TABS
25.0000 mg | ORAL_TABLET | Freq: Three times a day (TID) | ORAL | Status: DC | PRN
  Administered 2020-12-14 – 2020-12-15 (×4): 25 mg via ORAL
  Filled 2020-12-13 (×4): qty 1

## 2020-12-13 MED ORDER — PRAZOSIN HCL 1 MG PO CAPS
ORAL_CAPSULE | ORAL | Status: AC
  Filled 2020-12-13: qty 2

## 2020-12-13 MED ORDER — GABAPENTIN 300 MG PO CAPS
300.0000 mg | ORAL_CAPSULE | Freq: Two times a day (BID) | ORAL | Status: DC
  Administered 2020-12-13 – 2020-12-16 (×7): 300 mg via ORAL
  Filled 2020-12-13 (×7): qty 1

## 2020-12-13 NOTE — ED Notes (Signed)
Pt continues to cry due to pain on his legs. This writer has offered ice packs or pocket warmers to apply onto his legs but patient continues to cry and say "I would rather be taken somewhere else. Please".
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
## 2020-12-13 NOTE — ED Notes (Signed)
TTS in progress 
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
## 2020-12-13 NOTE — ED Provider Notes (Signed)
Patient has been cleared by behavioral health for discharge home.  He states that he is homeless.  So they are having the social worker provide some information to him.  Following this evaluation patient should be cleared for discharge.   Vanetta Mulders, MD 12/13/20 1135

## 2020-12-13 NOTE — ED Notes (Signed)
Pt asked to talk on the phone. Pt given phone and told the rules upon talking on the phone. Pt verbally agreed they understood.

## 2020-12-13 NOTE — BH Assessment (Addendum)
Patrick from [facility] called to see if the pt still needs placement, he will run the pt by their provider.   Natara Monfort D Moyinoluwa Dawe, MS, LCMHC, CRC Triage Specialist [phone]  

## 2020-12-13 NOTE — Progress Notes (Signed)
Per Vivien Presto, patient meets criteria for inpatient treatment. There are no available or appropriate beds at [facility] today. CSW faxed referrals to the following facilities for review:  [facility]  [place]  [facility]  [facility]  [place]  [facility]  [facility]  [place]  [facility]  [facility]  [facility]  [facility]  TTS will continue to seek bed placement.  Crissie Reese, MSW, LCSW-A, LCAS-A Phone: [phone] Disposition/TOC

## 2020-12-14 MED ORDER — NICOTINE 21 MG/24HR TD PT24
21.0000 mg | MEDICATED_PATCH | Freq: Once | TRANSDERMAL | Status: AC
  Administered 2020-12-14: 21 mg via TRANSDERMAL
  Filled 2020-12-14: qty 1
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
## 2020-12-14 NOTE — ED Notes (Signed)
Pt provided lunch, sitting up eating.

## 2020-12-14 NOTE — ED Notes (Signed)
TTS re assessment in progress °

## 2020-12-14 NOTE — ED Notes (Signed)
Patient given sprite with ice per request

## 2020-12-14 NOTE — ED Notes (Signed)
Pt requested PRN for anxiety, states "I'm just going crazy sitting in here. I'm just so anxious." Pt remains cooperative with staff. Watching TV at this time. Telesitter remains present.

## 2020-12-14 NOTE — ED Notes (Signed)
Old nicotine patch removed; new patch applied.

## 2020-12-15 NOTE — ED Notes (Signed)
Patient requesting condiments for his lunch and nutrition called for condiments and ensure.

## 2020-12-15 NOTE — ED Notes (Addendum)
Patient fussing about dinner due to having a grill cheese and that is not what he wanted. Patient educated on soft diet and that we have no control over food trays for meals. Patient given ensure. Patient ate 100% of his lunch.

## 2020-12-15 NOTE — ED Notes (Signed)
Patient given ginger ale at this time. 

## 2020-12-15 NOTE — ED Notes (Signed)
Patient states he needs soft foods and an ensure with his meal trays due to protein deficit. Patient states this his only needs at this time.

## 2020-12-15 NOTE — ED Notes (Signed)
Patient using the cellphone at this time.

## 2020-12-15 NOTE — ED Provider Notes (Signed)
Emergency Medicine Observation Re-evaluation Note  Thomas Bird is a 50 y.o. male, seen on rounds today.  Pt initially presented to the ED for complaints of homelessness, and SI. Currently calm, alert, no distress.   Physical Exam  BP 114/64 (BP Location: Left Arm)   Pulse (!) 54   Temp 97.7 F (36.5 C)   Resp 18   Ht 1.676 m (5\' 6" )   Wt 68 kg   SpO2 100%   BMI 24.21 kg/m  Physical Exam General: alert, content.  Cardiac: regular rate.  Lungs: breathing comfortably Psych: calm, alert. Normal mood/affect.   ED Course / MDM  EKG:EKG Interpretation  Date/Time:  Friday Dec 12 2020 20:13:16 EDT Ventricular Rate:  75 PR Interval:  160 QRS Duration: 82 QT Interval:  384 QTC Calculation: 428 R Axis:   76 Text Interpretation: Normal sinus rhythm Nonspecific ST abnormality Abnormal ECG No old tracing to compare Confirmed by 11-22-2001 [phone]) on 12/13/2020 7:50:05 AM   I have reviewed the labs performed to date as well as medications administered while in observation.  Recent changes in the last 24 hours include stabilization on meds, BH reassessment and placement efforts.   Plan  Current plan is for [facility] reassessment and placement.   Pts feelings of depression and SI seem to relate primarily to his homelessness.  Pt is eating/drinking, normal appetite.   [facility].      [facility], MD 12/15/20 1446
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
## 2020-12-15 NOTE — ED Notes (Signed)
Patient using the cellphone at this time. 

## 2020-12-15 NOTE — ED Notes (Signed)
Patient requesting soft foods and nutrition called for patient request and diet updated.

## 2020-12-15 NOTE — BH Assessment (Signed)
Per Vernard Gambles, NP, Patient continues to endorse SI with intent and a plan to overdose. Judgment and insight are poor. Patient continues to meet inpatient criteria.  [facility] AC Everardo Pacific, RN), notified of patient's bed needs and requested to consider patient for admission to [facility] via secure chat.

## 2020-12-15 NOTE — ED Notes (Signed)
Pt verbalizes SI with plan, denies HI,denies AVH. Pt started fussing at RN reporting "they brought me this little bitty sandwich till I raised hell then they brought me a full course meal" then "what are they gonna do about this" referring to scrapes to BLE when I mentioned triple antibiotic ointment pt reports "I haven't had a bath in three days" offered him a shower and clean scrubs when I asked if he asked to shower he says "I shouldn't have to ask" starts talking about being treated bad and having his family sue the hospital because "they are very well known".

## 2020-12-15 NOTE — ED Notes (Signed)
Report received from [place], [place]. Patient resting in bed with eyes closed. respirations even and unlabored. NAD noted.

## 2020-12-16 ENCOUNTER — Inpatient Hospital Stay (HOSPITAL_COMMUNITY)
Admission: AD | Admit: 2020-12-16 | Payer: Federal, State, Local not specified - Other | Source: Intra-hospital | Admitting: Psychiatry

## 2020-12-16 LAB — RESP PANEL BY RT-PCR (FLU A&B, COVID) ARPGX2
Influenza A by PCR: NEGATIVE
Influenza B by PCR: NEGATIVE
SARS Coronavirus 2 by RT PCR: NEGATIVE

## 2020-12-16 NOTE — Progress Notes (Signed)
Patient meets inpatient criteria per Vernard Gambles, NP.    Pt referred out at this time, Dr. Lucianne Muss recommended.    Patient was referred to the following facilities:   Service Provider Address Phone Fax  [facility]  [address], [place] [address] [phone]  [facility] [address] [place]  [address], [place] [address] [phone]  [facility]  [address] [place] [address] [phone]  [facility]  [address] [place], [place] [address] [phone]  [facility]  [address] [place] [address] [phone]  [facility]  [address] [place] [address] [phone]  [facility]  [address], [place] [address] [phone]  [facility]  [address], [place] [address] [phone]  [facility]  [address], [place] [address] [phone]  [facility]  [address], [place] [address] [phone]  [facility]  [address] [place] [address] [phone]  [facility]  [address] [place] [address] [phone]  [facility]  [address] [place] [address] [phone]  [facility]  [address], [place] [address] [phone]  [facility]  [address] [place] [address] [phone]  [facility]  [address] [place] [address] [phone]  [facility]  [address] [place] [address] [phone]  [facility]  [address] [place] [address] [phone]  [facility]  [address] [place] [address] [phone]  [facility] [address]  [address] [place] [address] [phone]  [facility]  [address] [place] [address] [phone]     CSW will continue to monitor for disposition.  Penni Homans, MSW, LCSW 12/16/2020 1:04 PM

## 2020-12-16 NOTE — ED Notes (Signed)
Pt updated on POC, placement around noon tomorrow, bed accepted. VS updated and stable. Pt denies further needs at this time. bed locked and low, call bell within reach. Tele-sitter remains at bedside. Will continue to monitor.

## 2020-12-16 NOTE — ED Notes (Signed)
Called [facility] for transport back [place] [address], [place].. Address given by Pt to Charge RN.

## 2020-12-16 NOTE — ED Notes (Signed)
Called [facility] for transfer to [facility].

## 2020-12-16 NOTE — ED Notes (Signed)
Per Despina Arias, [facility]:   Patient accepted to 307-1 and can arrive at 12 noon. Attending; Jola Babinski Dx: MDD

## 2020-12-16 NOTE — Consult Note (Addendum)
Telepsych Consultation   Reason for Consult:  Psychiatric reevaluation for suicidal ideations. Referring Physician:  Dr. Higinio PlanNanvanti Location of Patient: [facility] Location of Provider: [facility]  Patient Identification: Thomas FlackDonald W Bird MRN:  [id number] Principal Diagnosis: MDD (major depressive disorder), recurrent severe, without psychosis (HCC) Diagnosis:  Principal Problem:   MDD (major depressive disorder), recurrent severe, without psychosis (HCC) Active Problems:   Suicidal ideations   Uses marijuana   Alcohol abuse, in remission   Total Time spent with patient: 45 minutes  Subjective:   Thomas Bird is a 50 y.o. male patient admitted with patient voluntarily presented to the [facility] and admitted on 12/13/2020 with suicidal ideations with a plan to overdose  Thomas Bird, 50 y.o., male patient seen via tele health by this provider, consulted with Dr. Lucianne MussKumar; and chart reviewed on 05/10/22On evaluation Thomas Bird reports "the food is awful here".   HPI:   During evaluation Thomas Bird is in sitting position in no acute distress. He  is alert, oriented x 4, calm but becomes agitated through out the assessment. He is cooperative. He states his mood is depressed with congruent affect. He does not appear to be responding to internal/external stimuli or delusional thoughts.  Patient denies homicidal ideation, psychosis, and paranoia.   Patient continues to endorse suicidal thoughts. States if he is discharged he will over dose on pills. Discussed with patient his previous admissions, and the precipitating factor was his girlfriend kicked him out of home and he becomes suicidal. Patient denies the correlation. States the previous hospital admissions did not help him because he was not there long enough. States, "they let me go to soon". Patient became agitated discussing previous hospitalizations. States again how he is trying to get on disability and he is homeless.  States, "no one wants to help me". Patient becomes very agitated when is not getting what he wants. His voice becomes pressured and increased.   Patient states he slept fine last night, but the food is horrible states he cannot get a decent meal in the hospital, he cannot get up and get out of the bed and walk around. States he is sick of it. States he is ready to be transferred somewhere else.  States he would like to be at [facility]. Informed him that is not an option. Patient continue to discuss how he is trying to get on disability and how he can no longer work.  States he cannot afford transportation.  Reports he does not follow-up with any outpatient services because he has no transportation and no way to pay for his medications.  Discussed [facility] with patient and there services, he states it is too far to drive.    States he can not go back to his home because his girlfriend kicked him out. Patient continued to say how legally she cant do that because it has been his residence for 4 years.   Patient states his mom's name is [name] and her telephone number is [phone] gave permission to contact her. Mother states that she has never heard the patient say he wants to hurt himself and she has never known him to hurt himself. Reports she could not say if she thought patient could keep himself safe. States if patient were to get discharged home would have to live with his girlfriend.   Second call to tele health call to patient  Patient was agitated and asked why is it taking so long to  get him transferred he is tired of being in hospital and wants to be able to walk around. States the hospital food is garbage.   Offered patient [employer]. Patient declined. States he cant work. 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 States [place] will be too far from his family.  Patient became agitated and states he is ready to go home. States, " I am  sick of this place and I am leaving here with or without yalls permission". I asked patient if he would contract for safety and he said, "I will be safe, because I am getting the hell out of here!". Confirmed with patient is he would contract for safety he could be discharged home. He states "how am I going to get home walk". Discussed with patient if he feels safe to go home hospital staff can get him a ride to his mothers house. He stated, "yes now get me ride".  Patient reports that he cannot go anywhere else and cannot stay with anyone else because he is a sex offender registered. States he doesn't want to be on the streets and homeless. States the streets are  not safe. States, "you don't know what that feels like".  Patient began to put his socks on and asked for his clothes. States he is getting out this "hell hole". Reports he has been jerked around this hospital visit and he wants to go home to his mothers house. States "atleast I will get good meal this evening".  Patient refused resources for shelter. Resources were attached to [facility].   Case Consulted with Dr. Luci Bank Dr. Lucianne Muss: Patient has history of malingering, is currently homeless, there is extensive documentation in patient's chart through [facility] about secondary gain.   Past Psychiatric History:   Risk to Self:  passive only has plan if he is going to be discharged home.  Risk to Others:  denies  Prior Inpatient Therapy:  yes Prior Outpatient Therapy:  yes  Past Medical History:  Past Medical History:  Diagnosis Date  . Anxiety attack   . Back pain   . Bipolar 1 disorder (HCC)   . PTSD (post-traumatic stress disorder)     Past Surgical History:  Procedure Laterality Date  . CARPAL TUNNEL WITH CUBITAL TUNNEL Right   . FINGER SURGERY    . HAND SURGERY Right   . SHOULDER SURGERY  right   Family History:  Family History  Problem Relation Age of Onset  . Depression Mother    Family Psychiatric  History: pt reports mother  had a nervous breakdown.  Social History:  Social History   Substance and Sexual Activity  Alcohol Use Yes     Social History   Substance and Sexual Activity  Drug Use Yes  . Types: Marijuana    Social History   Socioeconomic History  . Marital status: Divorced    Spouse name: Not on file  . Number of children: Not on file  . Years of education: Not on file  . Highest education level: Not on file  Occupational History  . Not on file  Tobacco Use  . Smoking status: Current Some Day Smoker    Packs/day: 1.00    Types: Cigarettes  . Smokeless tobacco: Never Used  Vaping Use  . Vaping Use: Never used  Substance and Sexual Activity  . Alcohol use: Yes  . Drug use: Yes    Types: Marijuana  .  Sexual activity: Not on file  Other Topics Concern  . Not on file  Social History Narrative  . Not on file   Social Determinants of Health   Financial Resource Strain: Not on file  Food Insecurity: Not on file  Transportation Needs: Not on file  Physical Activity: Not on file  Stress: Not on file  Social Connections: Not on file   Additional Social History:    Allergies:   Allergies  Allergen Reactions  . Penicillins Other (See Comments)    UNKNOWN CHILDHOOD REACTION    Labs: No results found for this or any previous visit (from the past 48 hour(s)).  Medications:  Current Facility-Administered Medications  Medication Dose Route Frequency Provider Last Rate Last Admin  . doxycycline (VIBRA-TABS) tablet 100 mg  100 mg Oral Q12H Eber Hong, MD   100 mg at 12/16/20 0948  . gabapentin (NEURONTIN) capsule 300 mg  300 mg Oral BID Ophelia Shoulder E, NP   300 mg at 12/16/20 0948  . hydrOXYzine (ATARAX/VISTARIL) tablet 25 mg  25 mg Oral TID PRN Chales Abrahams, NP   25 mg at 12/15/20 0956  . prazosin (MINIPRESS) capsule 2 mg  2 mg Oral QHS Ophelia Shoulder E, NP   2 mg at 12/16/20 0005  . traZODone (DESYREL) tablet 150 mg  150 mg Oral QHS Ophelia Shoulder E, NP   150 mg at 12/16/20  0005   Current Outpatient Medications  Medication Sig Dispense Refill  . albuterol (VENTOLIN HFA) 108 (90 Base) MCG/ACT inhaler Inhale 2 puffs into the lungs every 6 (six) hours as needed for wheezing or shortness of breath.    . gabapentin (NEURONTIN) 300 MG capsule Take 300 mg by mouth 2 (two) times daily.    . meloxicam (MOBIC) 15 MG tablet Take 1 tablet (15 mg total) by mouth daily. 30 tablet 0  . pantoprazole (PROTONIX) 40 MG tablet Take 40 mg by mouth daily.    . prazosin (MINIPRESS) 2 MG capsule Take 2 mg by mouth at bedtime.    . traZODone (DESYREL) 150 MG tablet Take 150 mg by mouth at bedtime.    . methocarbamol (ROBAXIN) 500 MG tablet Take 1 tablet (500 mg total) by mouth 2 (two) times daily. (Patient not taking: No sig reported) 20 tablet 0    Musculoskeletal: Strength & Muscle Tone: within normal limits Gait & Station: normal Patient leans: N/A  Psychiatric Specialty Exam: Physical Exam Vitals reviewed.  HENT:     Head: Normocephalic.     Right Ear: Tympanic membrane normal.     Left Ear: Tympanic membrane normal.     Nose: Nose normal.     Mouth/Throat:     Pharynx: Oropharynx is clear.  Eyes:     Conjunctiva/sclera: Conjunctivae normal.  Cardiovascular:     Rate and Rhythm: Normal rate.  Pulmonary:     Effort: No respiratory distress.  Abdominal:     Tenderness: There is no guarding.  Musculoskeletal:        General: Normal range of motion.     Cervical back: Normal range of motion.  Skin:    General: Skin is dry.     Capillary Refill: Capillary refill takes less than 2 seconds.  Neurological:     Mental Status: He is alert and oriented to person, place, and time.  Psychiatric:        Attention and Perception: Attention and perception normal.        Mood and Affect: Mood is  depressed. Affect is angry.        Speech: Speech is rapid and pressured.        Behavior: Behavior is agitated. Behavior is cooperative.        Thought Content: Thought content  includes suicidal ideation.        Cognition and Memory: Cognition normal.        Judgment: Judgment is impulsive.     Review of Systems  Constitutional: Negative.   HENT: Negative.   Eyes: Negative.   Respiratory: Negative.   Cardiovascular: Negative.   Gastrointestinal: Negative.   Endocrine: Negative.   Genitourinary: Negative.   Musculoskeletal: Negative.   Skin: Negative.   Allergic/Immunologic: Negative.   Neurological: Negative.   Hematological: Negative.   Psychiatric/Behavioral: Positive for agitation and suicidal ideas.    Blood pressure 125/75, pulse (!) 54, temperature 97.6 F (36.4 C), resp. rate 20, height 5\' 6"  (1.676 m), weight 68 kg, SpO2 99 %.Body mass index is 24.21 kg/m.  General Appearance: Disheveled  Eye Contact:  Good  Speech:  Normal Rate and Pressured  Volume:  Increased  Mood:  Irritable  Affect:  Congruent  Thought Process:  Coherent  Orientation:  Full (Time, Place, and Person)  Thought Content:  Logical  Suicidal Thoughts:  Yes.  without intent/plan  Homicidal Thoughts:  No  Memory:  Immediate;   Good Recent;   Good Remote;   Good  Judgement:  Fair  Insight:  Fair  Psychomotor Activity:  Normal  Concentration:  Concentration: Good and Attention Span: Good  Recall:  Good  Fund of Knowledge:  Good  Language:  Good  Akathisia:  No  Handed:  Right  AIMS (if indicated):     Assets:  Communication Skills Desire for Improvement Physical Health Resilience Social Support Vocational/Educational  ADL's:  Intact  Cognition:  WNL  Sleep:        Treatment Plan Summary: SW for homeles shelters and out patient services. Discussed with patient the importance of following up out patient with providers. Encouraged patient to use resources for outpatient services provided.   No medication recommendations at this time .   Disposition: No evidence of imminent risk to self or others at present.   Patient does not meet criteria for psychiatric  inpatient admission. Supportive therapy provided about ongoing stressors. Discussed crisis plan, support from social network, calling 911, coming to the Emergency Department, and calling Suicide Hotline.  This service was provided via telemedicine using a 2-way, interactive audio and video technology.  Names of all persons participating in this telemedicine service and their role in this encounter. Name: Role: Patient   Name: Fulton Reek  Role: NP  Name: Dr. Vernard Gambles via telephone Role: Psychiatrist  Name: Role:    Lucianne Muss, NP 12/16/2020 3:29 PM

## 2020-12-16 NOTE — ED Notes (Signed)
Cancelled transfer by [employer] to [facility]

## 2020-12-16 NOTE — ED Notes (Signed)
Pt was unwilling to stay to have vitals or sign for discharge. Discharge paperwork explained. Pt ambulatory in hall and left facility

## 2020-12-16 NOTE — Discharge Instructions (Addendum)
We saw you in the ER for your mental health concerns and had our behavioral health team evaluate you. The team feels comfortable sending you home, please follow the recommendations given to you by them and the follow-up and medications they have prescribed to you. Please refrain from substance abuse. Return to the ER if your symptoms worsen.  

## 2020-12-16 NOTE — ED Notes (Signed)
[facility] contacted this RN and reported that Dr. Lucianne Muss was now rescinding the bed offer on this pt due to the pt not being appropriate for their facility.

## 2020-12-16 NOTE — Progress Notes (Signed)
Disposition: Patient states he is tired of being in the hospital and wants to go home. He wants a ride to his mothers home. Consulting civil engineer notified. SW added resources for homeless shelters and out patient services to AVS.  Patient is psych cleared.

## 2020-12-16 NOTE — ED Notes (Signed)
Pt off the phone and acting very irate and yelling out and talking loudly about where is he going and what is going on, Pt states "I have been here all day and nobody tells me nothing"  This RN informed pt in his notes it states he is to go somewhere tomorrow based on discharges; pt encouraged to be quiet and sit down; after pt told his disposition plan he would like his mother to be called to inform her of the information

## 2022-03-11 ENCOUNTER — Telehealth: Payer: Self-pay
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
## 2022-03-18 ENCOUNTER — Other Ambulatory Visit: Payer: Self-pay | Admitting: Physician Assistant

## 2022-03-18 ENCOUNTER — Ambulatory Visit: Payer: Self-pay | Admitting: Physician Assistant

## 2022-03-18 ENCOUNTER — Telehealth: Payer: Self-pay

## 2022-03-18 ENCOUNTER — Encounter: Payer: Self-pay | Admitting: Physician Assistant

## 2022-03-18 VITALS — BP 121/77 | HR 51 | Temp 97.9°F | Ht 66.0 in | Wt 128.0 lb

## 2022-03-18 DIAGNOSIS — G8929 Other chronic pain: Secondary | ICD-10-CM

## 2022-03-18 DIAGNOSIS — R634 Abnormal weight loss: Secondary | ICD-10-CM

## 2022-03-18 DIAGNOSIS — Z1211 Encounter for screening for malignant neoplasm of colon: Secondary | ICD-10-CM

## 2022-03-18 DIAGNOSIS — M545 Low back pain, unspecified: Secondary | ICD-10-CM

## 2022-03-18 DIAGNOSIS — M79641 Pain in right hand: Secondary | ICD-10-CM

## 2022-03-18 DIAGNOSIS — Z7689 Persons encountering health services in other specified circumstances: Secondary | ICD-10-CM

## 2022-03-18 DIAGNOSIS — Z125 Encounter for screening for malignant neoplasm of prostate: Secondary | ICD-10-CM

## 2022-03-18 DIAGNOSIS — Z13220 Encounter for screening for lipoid disorders: Secondary | ICD-10-CM

## 2022-03-18 DIAGNOSIS — F17200 Nicotine dependence, unspecified, uncomplicated: Secondary | ICD-10-CM

## 2022-03-18 DIAGNOSIS — M542 Cervicalgia: Secondary | ICD-10-CM

## 2022-03-18 DIAGNOSIS — R079 Chest pain, unspecified: Secondary | ICD-10-CM

## 2022-03-18 DIAGNOSIS — R630 Anorexia: Secondary | ICD-10-CM

## 2022-03-18 DIAGNOSIS — F1011 Alcohol abuse, in remission: Secondary | ICD-10-CM

## 2022-03-18 DIAGNOSIS — R001 Bradycardia, unspecified: Secondary | ICD-10-CM

## 2022-03-18 DIAGNOSIS — F32A Depression, unspecified: Secondary | ICD-10-CM

## 2022-03-18 DIAGNOSIS — R0602 Shortness of breath: Secondary | ICD-10-CM

## 2022-03-18 MED ORDER — ALBUTEROL SULFATE HFA 108 (90 BASE) MCG/ACT IN AERS: 2.0000 | INHALATION_SPRAY | Freq: Four times a day (QID) | RESPIRATORY_TRACT | 0 refills | Status: AC | PRN

## 2022-03-18 NOTE — Telephone Encounter (Addendum)
Called client for follow up after his appointment today at [facility] He states it went well, but he is "scared I am going to die, because my heart isn't beating fast enough". He states he didn't realize he was so sick. Discussed provider's plan that he needs to get his labs drawn so she can get enough information to decide next steps in regard to his heart. Pulse noted to be 51 in office today, blood pressure was reported as 122/   . Reassured that provider will check his labs and refer him to a cardiologist if needed. Encouraged client to take it one step at a time and not to immediately think the worst thing. He states understanding. Discussed referral to orthopedist about his pain and the history of his injury from 2016 and the reason why provider would want him to have CAFA. Discussed that he will need to get his labs drawn so that he will have a bill in [place] to move forward with the CAFA application. He states he plans on going tomorrow morning if his friend can take him. Explained that with CAFA approval it would prevent a big copayment out of pocket and also give his provider more options for referrals as needed. He states understanding and appreciates finally getting some concern and care that he needs. Discussed need to establish care with [facility] for his depression and anxiety. He states he is willing to give it another try, he does not remember the name of the provider that he had a bad experience with.Discussed that [facility] has walk in intakes M-F beginning as early as 8 am and that the process could take up to 3 hours and suggest going early morning if possible. No appointments are given for intake. He states understanding and acknowledges his need to go. He reports he has not had any sleep and doesn't sleep well, but attributes that to the pain he is in.  Gave reassurance that [facility] team is here to help him navigate. Reinforced to notify care connect if he has medical appointments  he does not have a friend to take him so that he does not miss appointments. He reports understanding.   Plan: Begin process of gathering needed documents for CAFA submission. Client to get his labs drawn to generate a bill in the [facility] system.  Continue to follow up with client regularly with myself and Tobe Sos MSW intern for extra support and assistance in health care navigation, focus on client establishing mental health services.  Referral was sent to [facility] for further assistance with resource needs such as possible disability application. Discussed with client that in order to prove disability it is important to have medical documentation and that means keeping his appointments to his provider, getting labs drawn, following with specialist referrals per provider recommendations and establishing mental health services for his mental and emotional needs. He understands that it is a process and it may take some time, but the goal would be steady work towards getting the care that he needs. He is very Adult nurse and states he understands. I have asked Ahnycea McCray to please follow up with referral to [facility].   Francee Nodal RN [employer]

## 2022-03-18 NOTE — Patient Instructions (Addendum)
-  bloodwork-  fasting labs (nothing to eat after midnight except water)- at [facility] Monday - Friday 8am-4pm -chest x-ray- [facility] Monday - Friday 8am-4pm -poop test -[employer] financial assistance application (contact [facility] to help) -go to [facility] for mental health care -orthopedist will call you for appointment

## 2022-03-18 NOTE — Progress Notes (Signed)
BP 121/77   Pulse (!) 51   Temp 97.9 F (36.6 C)   Ht 5\' 6"  (1.676 m)   Wt 128 lb (58.1 kg)   SpO2 97%   BMI 20.66 kg/m    Subjective:    Patient ID: , male    DOB: 09-18-1970, 51 y.o.   MRN: [id number]  HPI: Thomas Bird is a 51 y.o. male presenting on 03/18/2022 for New Patient (Initial Visit)   HPI   Chief Complaint  Patient presents with   New Patient (Initial Visit)    He does not work.     [facility] issues;  last time was several years ago.  He has also been to [facility] for the depression and [facility].    Pt reports that he Has sob.  He used inhaler in the past; his Last inhaler > a year ago.  He is Still smoking cigarettes 1 ppd and MJ daily.  He has not got covid vaccination.  He reports that he has CP every 2 or 3 days.    It comes with exertion.    It lasts 4-5 minutes.  He has Associated sob and diaphoresis.   He can't say if he get nausea because he has nausea all the time.  He has no history of heart issues.  Pt reports Weight loss.  He says he is not eating.  He says he has no appetitie,   he just never gets hungry  He isn't sleeping.    He denies any campling or sleeping outside.    Despite all of his complaints,  he says his pain is his #1 priority.  He then adds that he doesn't want pain pills.   He reports pain: - r kneecap -lower back -neck -r hand and wrist  All this started 2016 after he fell from a scaffold- was treated at [facility] and had some extensive surgeries.         Relevant past medical, surgical, family and social history reviewed and updated as indicated. Interim medical history since our last visit reviewed. Allergies and medications reviewed and updated.   No current outpatient medications on file.    Review of Systems  Per HPI unless specifically indicated above     Objective:    BP 121/77   Pulse (!) 51   Temp 97.9 F (36.6 C)   Ht 5\' 6"  (1.676 m)   Wt 128 lb (58.1 kg)    SpO2 97%   BMI 20.66 kg/m   Wt Readings from Last 3 Encounters:  03/18/22 128 lb (58.1 kg)  12/12/20 150 lb (68 kg)  08/10/20 175 lb (79.4 kg)    Weight 04/28/2015 at [facility]- 148 lb  (08/10/20 weight likely error- seen at triage and LWBS)   Physical Exam Vitals reviewed.  Constitutional:      General: He is not in acute distress.    Appearance: He is well-developed. He is not toxic-appearing.  HENT:     Head: Normocephalic and atraumatic.     Right Ear: Tympanic membrane, ear canal and external ear normal.     Left Ear: Tympanic membrane, ear canal and external ear normal.  Eyes:     Extraocular Movements: Extraocular movements intact.     Conjunctiva/sclera: Conjunctivae normal.     Pupils: Pupils are equal, round, and reactive to light.  Neck:     Thyroid: No thyromegaly.  Cardiovascular:     Rate  and Rhythm: Normal rate and regular rhythm.  Pulmonary:     Effort: Pulmonary effort is normal.     Breath sounds: Normal breath sounds. No wheezing or rales.  Abdominal:     General: Bowel sounds are normal.     Palpations: Abdomen is soft. There is no mass.     Tenderness: There is no abdominal tenderness.  Musculoskeletal:     Cervical back: Neck supple.     Right lower leg: No edema.     Left lower leg: No edema.  Lymphadenopathy:     Cervical: No cervical adenopathy.  Skin:    General: Skin is warm and dry.     Findings: No rash.  Neurological:     Mental Status: He is alert and oriented to person, place, and time.     Motor: No weakness or tremor.     Gait: Gait normal.  Psychiatric:        Attention and Perception: Attention normal.        Mood and Affect: Affect is angry.        Speech: Speech normal.     Comments: Pt was very angry, aggressive, hostile at onset of appointment.  By the end of the 60 minutes spent with him, he was less hostile and was talking more normally and acting less combative.       EKG-  sinus bradycardia at 43bpm.  No st-t changes.   Other than the rate, there are no changes compared to ekg done 12/12/20.      Assessment & Plan:    Encounter Diagnoses  Name Primary?   Encounter to establish care Yes   Tobacco use disorder    SOB (shortness of breath)    Anorexia    Right hand pain    Chronic pain of right knee    Chronic low back pain, unspecified back pain laterality, unspecified whether sciatica present    Neck pain    Depression, unspecified depression type    Chest pain, unspecified type    Bradycardia    Weight loss    Screening for colon cancer    Alcohol abuse, in remission    Screening for prostate cancer    Screening cholesterol level       -will get Updated labs -pt to get Cxr -EKG done in office -Refer to orthopedist.  Pt has multiple joint aches and pains from previous injury and surgeries.  Will not repeat any xrays at this time- he has had many- will have orthopedics decide what imaging they will need -pt is given C[facility]/application for [facility] [employer] financial assistance -pt encouraged to go to [facility] asap for MH.  Discussed with him that in light of his significant history and SI, he needs evaluation by psychiatrist -May consider refer to cardiology for bradycardia if not evident with testing - pt says he had colonsocpy several years ago at [facility].  Nothing seen in care everywhere that he was ever seen by GI.   Pt is given FIT test for colon cancer screening -rx albuterol MDI -discussed relationship with smoking and copd/sob -pt to follow up 3 weeks.  He is to contact office sooner prn changes or new symptoms

## 2022-03-19 ENCOUNTER — Other Ambulatory Visit (HOSPITAL_COMMUNITY)
Admission: RE | Admit: 2022-03-19 | Discharge: 2022-03-19 | Disposition: A | Discharge status: Home / Self Care | Payer: Self-pay | Source: Ambulatory Visit | Attending: Physician Assistant | Admitting: Physician Assistant

## 2022-03-19 ENCOUNTER — Ambulatory Visit (HOSPITAL_COMMUNITY)
Admission: RE | Admit: 2022-03-19 | Discharge: 2022-03-19 | Disposition: A | Discharge status: Home / Self Care | Payer: Self-pay | Source: Ambulatory Visit | Attending: Physician Assistant | Admitting: Physician Assistant

## 2022-03-19 DIAGNOSIS — R001 Bradycardia, unspecified: Secondary | ICD-10-CM

## 2022-03-19 DIAGNOSIS — R0602 Shortness of breath: Secondary | ICD-10-CM | POA: Insufficient documentation

## 2022-03-19 DIAGNOSIS — R630 Anorexia: Secondary | ICD-10-CM | POA: Insufficient documentation

## 2022-03-19 DIAGNOSIS — Z125 Encounter for screening for malignant neoplasm of prostate: Secondary | ICD-10-CM | POA: Insufficient documentation

## 2022-03-19 DIAGNOSIS — F1011 Alcohol abuse, in remission: Secondary | ICD-10-CM | POA: Insufficient documentation

## 2022-03-19 DIAGNOSIS — F32A Depression, unspecified: Secondary | ICD-10-CM | POA: Insufficient documentation

## 2022-03-19 DIAGNOSIS — R079 Chest pain, unspecified: Secondary | ICD-10-CM

## 2022-03-19 DIAGNOSIS — Z13220 Encounter for screening for lipoid disorders: Secondary | ICD-10-CM | POA: Insufficient documentation

## 2022-03-19 DIAGNOSIS — R634 Abnormal weight loss: Secondary | ICD-10-CM

## 2022-03-19 DIAGNOSIS — F17200 Nicotine dependence, unspecified, uncomplicated: Secondary | ICD-10-CM | POA: Insufficient documentation

## 2022-03-19 LAB — CBC WITH DIFFERENTIAL/PLATELET
Abs Immature Granulocytes: 0.02 10*3/uL (ref 0.00–0.07)
Basophils Absolute: 0 10*3/uL (ref 0.0–0.1)
Basophils Relative: 1 %
Eosinophils Absolute: 0.1 10*3/uL (ref 0.0–0.5)
Eosinophils Relative: 1 %
HCT: 43.7 % (ref 39.0–52.0)
Hemoglobin: 14.7 g/dL (ref 13.0–17.0)
Immature Granulocytes: 0 %
Lymphocytes Relative: 33 %
Lymphs Abs: 2.3 10*3/uL (ref 0.7–4.0)
MCH: 31.2 pg (ref 26.0–34.0)
MCHC: 33.6 g/dL (ref 30.0–36.0)
MCV: 92.8 fL (ref 80.0–100.0)
Monocytes Absolute: 0.6 10*3/uL (ref 0.1–1.0)
Monocytes Relative: 8 %
Neutro Abs: 4.1 10*3/uL (ref 1.7–7.7)
Neutrophils Relative %: 57 %
Platelets: 219 10*3/uL (ref 150–400)
RBC: 4.71 MIL/uL (ref 4.22–5.81)
RDW: 13.9 % (ref 11.5–15.5)
WBC: 7.1 10*3/uL (ref 4.0–10.5)
nRBC: 0 % (ref 0.0–0.2)

## 2022-03-19 LAB — LIPID PANEL
Cholesterol: 182 mg/dL (ref 0–200)
HDL: 42 mg/dL (ref >40)
LDL Cholesterol: 130 mg/dL — ABNORMAL HIGH (ref 0–99)
Total CHOL/HDL Ratio: 4.3 RATIO
Triglycerides: 48 mg/dL (ref <150)
VLDL: 10 mg/dL (ref 0–40)

## 2022-03-19 LAB — COMPREHENSIVE METABOLIC PANEL
ALT: 13 U/L (ref 0–44)
AST: 18 U/L (ref 15–41)
Albumin: 3.9 g/dL (ref 3.5–5.0)
Alkaline Phosphatase: 60 U/L (ref 38–126)
Anion gap: 4 — ABNORMAL LOW (ref 5–15)
BUN: 16 mg/dL (ref 6–20)
CO2: 27 mmol/L (ref 22–32)
Calcium: 8.7 mg/dL — ABNORMAL LOW (ref 8.9–10.3)
Chloride: 106 mmol/L (ref 98–111)
Creatinine, Ser: 0.88 mg/dL (ref 0.61–1.24)
GFR, Estimated: >60 mL/min (ref >60)
Glucose, Bld: 109 mg/dL — ABNORMAL HIGH (ref 70–99)
Potassium: 4.6 mmol/L (ref 3.5–5.1)
Sodium: 137 mmol/L (ref 135–145)
Total Bilirubin: 0.8 mg/dL (ref 0.3–1.2)
Total Protein: 6.8 g/dL (ref 6.5–8.1)

## 2022-03-19 LAB — TSH: TSH: 0.439 u[IU]/mL (ref 0.350–4.500)

## 2022-03-19 LAB — HEMOGLOBIN A1C
Hgb A1c MFr Bld: 5.5 % (ref 4.8–5.6)
Mean Plasma Glucose: 111.15 mg/dL

## 2022-03-19 LAB — PSA: Prostatic Specific Antigen: 0.38 ng/mL (ref 0.00–4.00)

## 2022-03-19 LAB — BRAIN NATRIURETIC PEPTIDE: B Natriuretic Peptide: 23 pg/mL (ref 0.0–100.0)

## 2022-03-25 ENCOUNTER — Other Ambulatory Visit: Payer: Self-pay | Admitting: Physician Assistant

## 2022-03-25 DIAGNOSIS — M542 Cervicalgia: Secondary | ICD-10-CM

## 2022-03-25 DIAGNOSIS — M79641 Pain in right hand: Secondary | ICD-10-CM

## 2022-03-25 DIAGNOSIS — G8929 Other chronic pain: Secondary | ICD-10-CM

## 2022-03-29 ENCOUNTER — Telehealth: Payer: Self-pay | Admitting: Physician Assistant

## 2022-03-29 NOTE — Telephone Encounter (Signed)
Pt called stating that he has chest pain and shortness of breath for three days.  He is told that he should go to the Emergency Room for evaluation.  Pt says he doesn't want to go to ER.  He has appt in this office on 04/07/22.  He was offered appointment this week and he declined to come in sooner than his appointment on 04/07/22.  Pt is again encouraged to go to ER for evaluation in light of complaints of CP and SOB.

## 2022-03-30 NOTE — Congregational Nurse Program (Unsigned)
Client has an appointment with a free clinic and receives assistance with medication through med assist. Client did not mention any concerns about food or housing insecurity. However, he did voice concerns about transportation , and I was able to provide him with the main line to [employer] for assistance.

## 2022-04-07 ENCOUNTER — Encounter: Payer: Self-pay | Admitting: Physician Assistant

## 2022-04-07 ENCOUNTER — Ambulatory Visit: Payer: Self-pay | Admitting: Physician Assistant

## 2022-04-07 VITALS — BP 108/70 | HR 60 | Temp 96.1°F | Ht 66.0 in | Wt 130.0 lb

## 2022-04-07 DIAGNOSIS — R634 Abnormal weight loss: Secondary | ICD-10-CM

## 2022-04-07 DIAGNOSIS — J439 Emphysema, unspecified: Secondary | ICD-10-CM

## 2022-04-07 DIAGNOSIS — F17200 Nicotine dependence, unspecified, uncomplicated: Secondary | ICD-10-CM

## 2022-04-07 DIAGNOSIS — E785 Hyperlipidemia, unspecified: Secondary | ICD-10-CM

## 2022-04-07 DIAGNOSIS — F32A Depression, unspecified: Secondary | ICD-10-CM

## 2022-04-07 DIAGNOSIS — R0602 Shortness of breath: Secondary | ICD-10-CM

## 2022-04-07 DIAGNOSIS — R079 Chest pain, unspecified: Secondary | ICD-10-CM

## 2022-04-07 MED ORDER — FLUTICASONE-SALMETEROL 100-50 MCG/ACT IN AEPB: 1.0000 | INHALATION_SPRAY | Freq: Two times a day (BID) | RESPIRATORY_TRACT | 0 refills | Status: AC

## 2022-04-07 NOTE — Patient Instructions (Addendum)
Started discus for copd. Inahler as needed Go to [facility] Smoking stopping You will be called with CT appointment Continue with orthopedist as they recommend

## 2022-04-07 NOTE — Progress Notes (Signed)
BP 108/70   Pulse 60   Temp (!) 96.1 F (35.6 C)   Ht 5\' 6"  (1.676 m)   Wt 130 lb (59 kg)   SpO2 97%   BMI 20.98 kg/m    Subjective:    Patient ID: , male    DOB: [birth year], 51 y.o.   MRN: [id number]  HPI: Thomas Bird is a 51 y.o. male presenting on 04/07/2022 for Follow-up   HPI  Pt is 51yoM who presents for follow up of new pt appointment on 03/18/22.  He saw orthopedist per referral. He has not been to [facility] for MH issues as recommended  He is using his inhaler and it is helping his sob.   He still feels tight and sore in his chest- comes and goes- hurts more with deep breaths.  He is using the inhaler bid on avg.  He contiinues to smoke.  He submitted his cafa, he thinks  He hasn't returned his FIT test      Relevant past medical, surgical, family and social history reviewed and updated as indicated. Interim medical history since our last visit reviewed. Allergies and medications reviewed and updated.   Current Outpatient Medications:    albuterol (VENTOLIN HFA) 108 (90 Base) MCG/ACT inhaler, Inhale 2 puffs into the lungs every 6 (six) hours as needed for wheezing or shortness of breath., Disp: 3 each, Rfl: 0    Review of Systems  Per HPI unless specifically indicated above     Objective:    BP 108/70   Pulse 60   Temp (!) 96.1 F (35.6 C)   Ht 5\' 6"  (1.676 m)   Wt 130 lb (59 kg)   SpO2 97%   BMI 20.98 kg/m   Wt Readings from Last 3 Encounters:  04/07/22 130 lb (59 kg)  03/18/22 128 lb (58.1 kg)  12/12/20 150 lb (68 kg)    Physical Exam Vitals reviewed.  Constitutional:      General: He is not in acute distress.    Appearance: He is well-developed. He is not ill-appearing.  HENT:     Head: Normocephalic and atraumatic.  Cardiovascular:     Rate and Rhythm: Normal rate and regular rhythm.  Pulmonary:     Effort: Pulmonary effort is normal.     Breath sounds: Normal breath sounds. No wheezing.  Chest:     Chest wall:  Tenderness present. No mass or swelling.     Comments: Tenderness to palpation center of chest over sternum Abdominal:     General: Bowel sounds are normal.     Palpations: Abdomen is soft.     Tenderness: There is no abdominal tenderness.  Musculoskeletal:     Cervical back: Neck supple.  Lymphadenopathy:     Cervical: No cervical adenopathy.  Skin:    General: Skin is warm and dry.  Neurological:     Mental Status: He is alert and oriented to person, place, and time.  Psychiatric:        Behavior: Behavior normal.     Results for orders placed or performed during the hospital encounter of 03/19/22  CBC w/Diff/Platelet  Result Value Ref Range   WBC 7.1 4.0 - 10.5 K/uL   RBC 4.71 4.22 - 5.81 MIL/uL   Hemoglobin 14.7 13.0 - 17.0 g/dL   HCT 02/11/21 05/19/22 - 27.0 %   MCV 92.8 80.0 - 100.0 fL   MCH 31.2 26.0 - 34.0 pg   MCHC 33.6 30.0 - 36.0  g/dL   RDW 60.4 54.0 - 98.1 %   Platelets 219 150 - 400 K/uL   nRBC 0.0 0.0 - 0.2 %   Neutrophils Relative % 57 %   Neutro Abs 4.1 1.7 - 7.7 K/uL   Lymphocytes Relative 33 %   Lymphs Abs 2.3 0.7 - 4.0 K/uL   Monocytes Relative 8 %   Monocytes Absolute 0.6 0.1 - 1.0 K/uL   Eosinophils Relative 1 %   Eosinophils Absolute 0.1 0.0 - 0.5 K/uL   Basophils Relative 1 %   Basophils Absolute 0.0 0.0 - 0.1 K/uL   Immature Granulocytes 0 %   Abs Immature Granulocytes 0.02 0.00 - 0.07 K/uL  PSA  Result Value Ref Range   Prostatic Specific Antigen 0.38 0.00 - 4.00 ng/mL  Hemoglobin A1c  Result Value Ref Range   Hgb A1c MFr Bld 5.5 4.8 - 5.6 %   Mean Plasma Glucose 111.15 mg/dL  Brain natriuretic peptide  Result Value Ref Range   B Natriuretic Peptide 23.0 0.0 - 100.0 pg/mL  TSH  Result Value Ref Range   TSH 0.439 0.350 - 4.500 uIU/mL  Lipid panel  Result Value Ref Range   Cholesterol 182 0 - 200 mg/dL   Triglycerides 48 <191 mg/dL   HDL 42 >47 mg/dL   Total CHOL/HDL Ratio 4.3 RATIO   VLDL 10 0 - 40 mg/dL   LDL Cholesterol 829 (H) 0 - 99  mg/dL  Comprehensive metabolic panel  Result Value Ref Range   Sodium 137 135 - 145 mmol/L   Potassium 4.6 3.5 - 5.1 mmol/L   Chloride 106 98 - 111 mmol/L   CO2 27 22 - 32 mmol/L   Glucose, Bld 109 (H) 70 - 99 mg/dL   BUN 16 6 - 20 mg/dL   Creatinine, Ser 5.62 0.61 - 1.24 mg/dL   Calcium 8.7 (L) 8.9 - 10.3 mg/dL   Total Protein 6.8 6.5 - 8.1 g/dL   Albumin 3.9 3.5 - 5.0 g/dL   AST 18 15 - 41 U/L   ALT 13 0 - 44 U/L   Alkaline Phosphatase 60 38 - 126 U/L   Total Bilirubin 0.8 0.3 - 1.2 mg/dL   GFR, Estimated >13 >08 mL/min   Anion gap 4 (L) 5 - 15      Assessment & Plan:    Encounter Diagnoses  Name Primary?   Pulmonary emphysema, unspecified emphysema type (HCC) Yes   Tobacco use disorder    SOB (shortness of breath)    Weight loss    Depression, unspecified depression type    Chest pain, unspecified type    Hyperlipidemia, unspecified hyperlipidemia type      Reviewed labs and CXR with pt   Weight loss-    148 lb (04/28/2015 at [facility])  today 130.   Pt reports he weighed 185 lb before he fell off the scaffold in 2015/when he came out of prison in 2008.  Marland Kitchen  ER visit 05/2013- weight 150 lb.   -obtain CT chest and abd/pelvis  Elevated cholesterol.   Will defer for now until after CTs   Emphysema/Copd/sob Add fluticasone/salmeterol discus.  Pt educated on how to use it and to rinse mouth after use.  Continue albuterol prn Counseled smoking cesation  Pt encouraged to go to [facility] for evaluation by psychiatrist  Bradycardia improved  Pt to continue with orthopedics per their recommendations  Pt reminded to return FIT test.  Pt to follow up here 6 weeks.  He is  to contact office sooner prn

## 2022-05-10 ENCOUNTER — Ambulatory Visit (HOSPITAL_COMMUNITY)
Admission: RE | Admit: 2022-05-10 | Discharge: 2022-05-10 | Disposition: A | Discharge status: Home / Self Care | Payer: Medicaid Other | Source: Ambulatory Visit | Attending: Physician Assistant | Admitting: Physician Assistant

## 2022-05-10 DIAGNOSIS — R634 Abnormal weight loss: Secondary | ICD-10-CM | POA: Insufficient documentation

## 2022-05-10 DIAGNOSIS — F17200 Nicotine dependence, unspecified, uncomplicated: Secondary | ICD-10-CM | POA: Insufficient documentation

## 2022-05-10 MED ORDER — IOHEXOL 300 MG/ML  SOLN
100.0000 mL | Freq: Once | INTRAMUSCULAR | Status: AC | PRN
  Administered 2022-05-10: 100 mL via INTRAVENOUS

## 2022-05-19 ENCOUNTER — Ambulatory Visit: Payer: Self-pay | Admitting: Physician Assistant

## 2022-05-19 ENCOUNTER — Encounter: Payer: Self-pay | Admitting: Physician Assistant

## 2022-05-19 DIAGNOSIS — R062 Wheezing: Secondary | ICD-10-CM

## 2022-05-19 DIAGNOSIS — R0602 Shortness of breath: Secondary | ICD-10-CM

## 2022-05-19 DIAGNOSIS — F17200 Nicotine dependence, unspecified, uncomplicated: Secondary | ICD-10-CM

## 2022-05-19 DIAGNOSIS — R634 Abnormal weight loss: Secondary | ICD-10-CM

## 2022-05-19 DIAGNOSIS — J439 Emphysema, unspecified: Secondary | ICD-10-CM

## 2022-05-19 DIAGNOSIS — J069 Acute upper respiratory infection, unspecified: Secondary | ICD-10-CM

## 2022-05-19 MED ORDER — PREDNISONE 20 MG PO TABS: 20.0000 mg | ORAL_TABLET | Freq: Two times a day (BID) | ORAL | 0 refills | Status: AC

## 2022-05-19 NOTE — Progress Notes (Signed)
There were no vitals taken for this visit.   Subjective:    Patient ID: Thomas Bird, male    DOB: [birth year], 51 y.o.   MRN: [id number]  HPI: Thomas Bird is a 51 y.o. male presenting on 05/19/2022 for No chief complaint on file.   HPI  This is a telemedicine appointment through [employer]  I connected with  Anthem Butikofer Scherer on 05/19/22 by a video enabled telemedicine application and verified that I am speaking with the correct person using two identifiers.   I discussed the limitations of evaluation and management by telemedicine. The patient expressed understanding and agreed to proceed.  Pt is at home.  Provider is at office.    Pt was scheduled for in-person appointment today but had his wife call to say that he was feeling bad.  Appointment was changed to virtual appointment.   Pt says he has been Feeling bad since yesterday.  He reports Coughing, No fever.  He says his Chest hurts when he is coughing and feels tight.  He says his Legs are hurting and jumping and achey.  He does report some wheezing.  He says His inhalers help some.  He continues to smoke.   He did not take covid test.  He has not yet submitted his cafa (to cover his CT scans)  Pt reports still lots of pain and pain and pain.  He has been going to orthopedics for this.        Relevant past medical, surgical, family and social history reviewed and updated as indicated. Interim medical history since our last visit reviewed. Allergies and medications reviewed and updated.    Current Outpatient Medications:    albuterol (VENTOLIN HFA) 108 (90 Base) MCG/ACT inhaler, Inhale 2 puffs into the lungs every 6 (six) hours as needed for wheezing or shortness of breath., Disp: 3 each, Rfl: 0   fluticasone-salmeterol (ADVAIR) 100-50 MCG/ACT AEPB, Inhale 1 puff into the lungs 2 (two) times daily., Disp: 1 each, Rfl: 0   Review of Systems  Per HPI unless specifically indicated above     Objective:    There  were no vitals taken for this visit.  Wt Readings from Last 3 Encounters:  04/07/22 130 lb (59 kg)  03/18/22 128 lb (58.1 kg)  12/12/20 150 lb (68 kg)    Physical Exam Constitutional:      General: He is not in acute distress.    Appearance: He is not toxic-appearing.  HENT:     Head: Normocephalic and atraumatic.  Pulmonary:     Effort: Pulmonary effort is normal. No respiratory distress.  Neurological:     Mental Status: He is alert and oriented to person, place, and time.  Psychiatric:        Behavior: Behavior normal.           Assessment & Plan:    Encounter Diagnoses  Name Primary?   Pulmonary emphysema, unspecified emphysema type ([place]) Yes   Tobacco use disorder    SOB (shortness of breath)    Wheezing    Unintentional weight loss    Upper respiratory tract infection, unspecified type      Weight loss -Reviewed results CT scans- thickened wall proximal stomach, atherosclerosis, emphysema Lost 35 pound per patient.  Documented loss of weight in Epic at least 20 pounds.  Pt is not trying to lose weight -will Refer to GI in light of unintentional weight loss  uninsured -pt urged  to Complete and  submit cafa/ [facility] financial assistance Application   Pain pain pain- hip, knee, back -pt to Continue with orthopedics  copd -May increase advair doseage at next appointment if baseline not improved.   Exacerbation copd/URI -rx Prednisone to [employer] [facility]  -pt encouraged to self test for covid.  He is to notify office if positive -pt is encouraged to avoid smoking   -pt to follow up 1 month.  He is to contact office sooner prn worsening or new symptoms

## 2022-05-21 ENCOUNTER — Telehealth: Payer: Self-pay

## 2022-05-21 NOTE — Telephone Encounter (Signed)
Returned call to [employer] client. He is in process of being referred to specialist for his knees, back and hip that he had a sustained an injury from an accident years ago. He has been seeing an orthopedist. 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 Discussed a possible referral to [place] and Reyne Dumas for possible assistance. Client is agreeable to this referral. Discussed that [facility] would determine if they would be able to assist. If they assist they would help gather supportive medical records for client to support his application. He states understanding. Will proceed with referral to [facility] care.   [place] [employer]

## 2022-05-24 ENCOUNTER — Encounter (INDEPENDENT_AMBULATORY_CARE_PROVIDER_SITE_OTHER): Payer: Self-pay | Admitting: *Deleted

## 2022-06-04 ENCOUNTER — Telehealth: Payer: Self-pay

## 2022-06-04 NOTE — Telephone Encounter (Signed)
Called client for follow up. Notified client that I did speak with Thomas Bird SW with integrated [facility] yesterday and they have accepted to attempt to help him with applying for disability, Medicaid and other Social service needs. He states that [name] this morning and is going to assist him and he also is going to attempt to help get his food stamps reinstated. Suggested client request letter for orthopedist who has assessed and is treating his knee, hip and back issues. He states understanding.  Reminded client of the importation of keeping all appointments and rescheduling if needed and contacting us for transportation if needed for medical appointments. Discussed that to have proper documentation of a qualifying disability he will need that documentation of those medical visits. He states that he is going to reschedule his PT as he hasn't gone due to not having a copayment. He was referred to [facility].  Mental Health discussed again, we had previously discussed that [facility] would be a good option to also help him with the stress and mental health issues he is experiencing for extra support, He had previously agreed, but had not gone yet. He states he plans on taking care of that this week also.   Other needs: His scooter is not working and he is unsure of what is wrong, he states, he is "tired of pouring money into it when he was working and now he isn't working he can't" reminded to notify care connect if he needs transportation for medical visits. He states understanding.  Food: Client has a friend [name] that has taken him to appointments and to pick up food, she is older . I discussed possibility of [name] coming by the food pantry today to pick up some things for him or someone could possibly meet her halfway. He states "[name] has her own life and she is busy doing things today and is in [place] and [place], asked client to speak with her and maybe on her way back home could stop by [name] to pick up him some food. He states he will see. Reminded him we are open today until 5 PM and that we still have a few chicken leg quarters and some canned things. He states he appreciates the call and the support and encouragement.   Will continue to follow for further needs.  [name]

## 2022-06-10 ENCOUNTER — Encounter: Payer: Self-pay | Admitting: Physician Assistant

## 2022-06-10 ENCOUNTER — Telehealth: Payer: Self-pay

## 2022-06-10 NOTE — Telephone Encounter (Signed)
Called client for follow up regarding the previous follow up while with Reyne Dumas of [facility]. Client shared he had medications from Orthopedics for Diclofenac as well as Prednisone from 05/19/22 visit with [facility] provider that he had not filled due to cost. He is not able to work at this time. [facility] is working to assist with [employer] renewal as well as Medicaid and Disability applications.  Client shared that he was still having respiratory issues. Called [facility] to talk with [facility] LPN to share the information for guidance from provider on filling the medication or needing another visit. Illene Bolus will follow up with provider and follow up with patient.    Will monitor for update.  Update: 1:00 PM Reviewed chart and Berenice LPN from [facility] followed up prescription was sent to [facility] and cost is a little over 4.$  Called client to follow up and client states he appreciates the follow up and assistance and that he can afford that medication. Client understands that he can call care connect if he has issues with affording medications in the future. He states understanding.  Will continue to follow.  [place] [employer]

## 2022-06-10 NOTE — Progress Notes (Addendum)
LPN called pt to f/u due to some concerns from his visit with [facility], but pt did not answer, LPN lvm asking pt to call the office if he has any questions/ concerns.  Pt returned call. Pt mentions same complaints as in his 05-19-22 visit with [facility] where he was prescribed prednisone.   Pt c/o of cough, chest soreness from coughing, some wheezing. Pt is still using his inhalers, but did not get rx prednisone due to not being able to afford. Pt is still smoking. Pt was encouraged smoking cessation.   Pt was informed that prednisone would be $4.52 at [facility]. Pt states he can manage that amount and is in agreement with having his prescription transferred from [place] to [facility]. Pt is to contact office if he has any problems and/ or if he has no improvements after finishing prednisone. Pt verbalized understanding.  LPN called [facility] to request transfer.

## 2022-06-15 ENCOUNTER — Telehealth: Payer: Self-pay

## 2022-06-15 NOTE — Telephone Encounter (Signed)
Called client to follow up and to check in. Roommate answered stating he is asleep right now. Asked her not to wake him, that I'd call back later today or tomorrow.  [place] [employer]

## 2022-06-16 ENCOUNTER — Ambulatory Visit: Payer: Self-pay | Admitting: Physician Assistant

## 2022-06-18 ENCOUNTER — Telehealth: Payer: Self-pay

## 2022-06-18 NOTE — Telephone Encounter (Signed)
Client returned call from 06/15/23 for follow up.  Called to see if client was able to pick up his prednisone from [facility] and has been taking it as prescribed. Client reports he is taking it and he is having less "lung pain" when breathing in, he still reports some shortness of breath at times. Discussed if his symptoms change and shortness does not improve or worsens to contact his provider at [facility], he reports understanding. He is eating food when taking the medication, he also reports it has also helped his joint pain some. The only "bad thing" is that I'm more grumpy since taking it. He understands from past times of taking it this can be how some people react to this medication.  Food: He reports that [name] SW for [facility] was able to assist him to have his food stamps reinstated and he received over 200$ on his card. He was able to shop for food last evening . He is still upset over his difficulty obtaining disability. He is also working with Fulton Reek SW integrated with this issue.  Will continue to follow as needed.   Francee Nodal RN [employer]

## 2022-06-21 ENCOUNTER — Telehealth: Payer: Self-pay

## 2022-06-21 NOTE — Telephone Encounter (Signed)
Returned call to client who states his PT that he had last Friday has caused his right knee to swell and hurt worse. He says it is still swollen but pain is some better. Recommended he call Ortho office in [place] to let them know how his therapy affected his knee this time as they had referred him. At client's request text messaged number to Orthopedic office in [place] to him and recommended he call today.  Client is aware that he has an appointment tomorrow to [facility]. No other needs at this time, will continue to follow as needed.  Francee Nodal RN [employer]

## 2022-06-22 ENCOUNTER — Encounter: Payer: Self-pay | Admitting: Physician Assistant

## 2022-06-22 ENCOUNTER — Ambulatory Visit: Payer: Self-pay | Admitting: Physician Assistant

## 2022-06-22 VITALS — BP 90/62 | HR 61 | Temp 97.9°F | Ht 66.0 in | Wt 130.0 lb

## 2022-06-22 DIAGNOSIS — R634 Abnormal weight loss: Secondary | ICD-10-CM

## 2022-06-22 DIAGNOSIS — J439 Emphysema, unspecified: Secondary | ICD-10-CM

## 2022-06-22 DIAGNOSIS — F17200 Nicotine dependence, unspecified, uncomplicated: Secondary | ICD-10-CM

## 2022-06-22 DIAGNOSIS — G8929 Other chronic pain: Secondary | ICD-10-CM

## 2022-06-22 NOTE — Progress Notes (Signed)
BP 90/62   Pulse 61   Temp 97.9 F (36.6 C)   Ht 5\' 6"  (1.676 m)   Wt 130 lb (59 kg)   SpO2 97%   BMI 20.98 kg/m    Subjective:    Patient ID: Thomas Bird, male    DOB: [birth year], 51 y.o.   MRN: [id number]  HPI: Thomas Bird is a 51 y.o. male presenting on 06/22/2022 for COPD and Weight Loss   HPI   Chief Complaint  Patient presents with   COPD   Weight Loss     He got his  cafa /application for [employer] financial assistance submitted  He was Referred to GI for weight loss and abn CT; he has appt 12/19  Pt Continues with orthopedics and PT for pain  His Copd and breathing are improved.  he is still smoking but has cut back from 2 ppd to 1/2 ppd.  He continues to use his inhalers.     Relevant past medical, surgical, family and social history reviewed and updated as indicated. Interim medical history since our last visit reviewed. Allergies and medications reviewed and updated.    Current Outpatient Medications:    albuterol (VENTOLIN HFA) 108 (90 Base) MCG/ACT inhaler, Inhale 2 puffs into the lungs every 6 (six) hours as needed for wheezing or shortness of breath., Disp: 3 each, Rfl: 0   fluticasone-salmeterol (ADVAIR) 100-50 MCG/ACT AEPB, Inhale 1 puff into the lungs 2 (two) times daily., Disp: 1 each, Rfl: 0   Review of Systems  Per HPI unless specifically indicated above     Objective:    BP 90/62   Pulse 61   Temp 97.9 F (36.6 C)   Ht 5\' 6"  (1.676 m)   Wt 130 lb (59 kg)   SpO2 97%   BMI 20.98 kg/m   Wt Readings from Last 3 Encounters:  06/22/22 130 lb (59 kg)  04/07/22 130 lb (59 kg)  03/18/22 128 lb (58.1 kg)    Physical Exam Vitals reviewed.  Constitutional:      General: He is not in acute distress.    Appearance: He is well-developed. He is not toxic-appearing.  HENT:     Head: Normocephalic and atraumatic.  Cardiovascular:     Rate and Rhythm: Normal rate and regular rhythm.  Pulmonary:     Effort: Pulmonary effort  is normal.     Breath sounds: No wheezing, rhonchi or rales.  Abdominal:     General: Bowel sounds are normal.     Palpations: Abdomen is soft.     Tenderness: There is no abdominal tenderness.  Musculoskeletal:     Cervical back: Neck supple.     Right lower leg: No edema.     Left lower leg: No edema.  Lymphadenopathy:     Cervical: No cervical adenopathy.  Skin:    General: Skin is warm and dry.  Neurological:     Mental Status: He is alert and oriented to person, place, and time.  Psychiatric:        Behavior: Behavior normal.           Assessment & Plan:    Encounter Diagnoses  Name Primary?   Pulmonary emphysema, unspecified emphysema type ([place]) Yes   Tobacco use disorder    Unintentional weight loss    Chronic pain of right knee        -Pt encouraged to continue smoking cessation efforts -pt will continue with current inhalers -pt to  see GI as scheduled -pt to continue with orthopedics and PT per their recommendations -pt to follow up 2-3 months.  He is to contact office sooner prn

## 2022-06-23 ENCOUNTER — Ambulatory Visit: Payer: Self-pay | Admitting: Physician Assistant

## 2022-06-23 ENCOUNTER — Telehealth: Payer: Self-pay

## 2022-06-23 NOTE — Telephone Encounter (Signed)
Returned call from early am left on message machine to client of [facility]. He states his knee from PT last Friday is hurting worse and he has not been able to sleep for 2 nights. Last talked with client on Monday 06/21/22 regarding increased pain and swelling recommended at that time that he contact [facility] in [place] regarding increased pain since therapy last. He also stated on Monday that his pain was some better but worse over than weekend and that he had slept Sunday night prior to our conversation.  Today he states he called, but unsure as he states he cannot remember things and "no one is understanding him or helping him". Discussed today with his friend/roommate on the phone too, to call [facility] orthopedics in [place] this morning and to let them know that since last Friday at PT he has been hurting worse and had swelling and has been unable to sleep. Phone number provided to them and ask that they please call back. There are no notes in Care [facility] about a call to the Ortho MD. He states he did cancel therapy this week. Discussed that the provider will need to speak to him to understand what he is feeling and decide if they need to see him sooner. He and roommate states understanding.  Will continue to follow.  Francee Nodal RN [employer]

## 2022-06-23 NOTE — Telephone Encounter (Signed)
Attempted return call from message left, no answer, left message to pleaes return call.   Francee Nodal RN  [employer] [employer]

## 2022-06-25 ENCOUNTER — Telehealth: Payer: Self-pay

## 2022-06-25 NOTE — Telephone Encounter (Signed)
Client's friend Karoline Caldwell called wanting to know the date and times for the [employer] meal giveaway they signed up for  Information provided via text of Satira Anis [facility] Saturday November 18 from 10-1 pm drive through Located at [address] [place] [address]   Francee Nodal RN [facility]

## 2022-06-30 ENCOUNTER — Encounter: Payer: Self-pay | Admitting: Physician Assistant

## 2022-07-20 ENCOUNTER — Ambulatory Visit: Payer: Self-pay

## 2022-07-27 ENCOUNTER — Ambulatory Visit (INDEPENDENT_AMBULATORY_CARE_PROVIDER_SITE_OTHER): Payer: Medicaid Other | Admitting: Gastroenterology

## 2022-07-27 ENCOUNTER — Encounter (INDEPENDENT_AMBULATORY_CARE_PROVIDER_SITE_OTHER): Payer: Self-pay | Admitting: Gastroenterology

## 2022-08-17 ENCOUNTER — Ambulatory Visit: Payer: Self-pay

## 2022-09-07 ENCOUNTER — Ambulatory Visit: Payer: Self-pay | Admitting: Physician Assistant
